# Patient Record
Sex: Female | Born: 1995 | Race: Black or African American | Hispanic: No | Smoking: Former smoker
Health system: Southern US, Community
[De-identification: ages and names within clinical notes are randomized; demographics above are authoritative.]

## PROBLEM LIST (undated history)

## (undated) ENCOUNTER — Inpatient Hospital Stay: Payer: Self-pay

## (undated) DIAGNOSIS — R51 Headache: Secondary | ICD-10-CM

## (undated) HISTORY — DX: Headache: R51

---
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
## 2014-01-29 ENCOUNTER — Encounter (HOSPITAL_COMMUNITY): Payer: Self-pay

## 2014-01-29 ENCOUNTER — Emergency Department (HOSPITAL_COMMUNITY)
Admission: EM | Admit: 2014-01-29 | Discharge: 2014-01-29 | Discharge status: Against Medical Advice | Attending: Emergency Medicine | Admitting: Emergency Medicine

## 2014-01-29 DIAGNOSIS — N898 Other specified noninflammatory disorders of vagina: Secondary | ICD-10-CM | POA: Diagnosis not present

## 2014-01-29 DIAGNOSIS — R109 Unspecified abdominal pain: Secondary | ICD-10-CM | POA: Diagnosis present

## 2014-01-29 LAB — URINALYSIS, ROUTINE W REFLEX MICROSCOPIC
BILIRUBIN URINE: NEGATIVE
Glucose, UA: NEGATIVE mg/dL
Ketones, ur: NEGATIVE mg/dL
Nitrite: NEGATIVE
PROTEIN: NEGATIVE mg/dL
SPECIFIC GRAVITY, URINE: 1.019 (ref 1.005–1.030)
Urobilinogen, UA: 0.2 mg/dL (ref 0.0–1.0)
pH: 7.5 (ref 5.0–8.0)

## 2014-01-29 LAB — CBC WITH DIFFERENTIAL/PLATELET
BASOS PCT: 0 % (ref 0–1)
Basophils Absolute: 0 10*3/uL (ref 0.0–0.1)
Eosinophils Absolute: 0.1 10*3/uL (ref 0.0–0.7)
Eosinophils Relative: 2 % (ref 0–5)
HCT: 37.9 % (ref 36.0–46.0)
Hemoglobin: 12.7 g/dL (ref 12.0–15.0)
LYMPHS ABS: 2.4 10*3/uL (ref 0.7–4.0)
LYMPHS PCT: 39 % (ref 12–46)
MCH: 29.3 pg (ref 26.0–34.0)
MCHC: 33.5 g/dL (ref 30.0–36.0)
MCV: 87.5 fL (ref 78.0–100.0)
MONOS PCT: 6 % (ref 3–12)
Monocytes Absolute: 0.4 10*3/uL (ref 0.1–1.0)
Neutro Abs: 3.3 10*3/uL (ref 1.7–7.7)
Neutrophils Relative %: 53 % (ref 43–77)
Platelets: 181 10*3/uL (ref 150–400)
RBC: 4.33 MIL/uL (ref 3.87–5.11)
RDW: 13 % (ref 11.5–15.5)
WBC: 6.2 10*3/uL (ref 4.0–10.5)

## 2014-01-29 LAB — COMPREHENSIVE METABOLIC PANEL
ALK PHOS: 45 U/L (ref 39–117)
ALT: 12 U/L (ref 0–35)
AST: 16 U/L (ref 0–37)
Albumin: 4.5 g/dL (ref 3.5–5.2)
Anion gap: 4 — ABNORMAL LOW (ref 5–15)
BUN: 10 mg/dL (ref 6–23)
CO2: 26 mmol/L (ref 19–32)
CREATININE: 0.59 mg/dL (ref 0.50–1.10)
Calcium: 9.3 mg/dL (ref 8.4–10.5)
Chloride: 109 mEq/L (ref 96–112)
GFR calc non Af Amer: >90 mL/min (ref >90)
GLUCOSE: 93 mg/dL (ref 70–99)
POTASSIUM: 4 mmol/L (ref 3.5–5.1)
SODIUM: 139 mmol/L (ref 135–145)
TOTAL PROTEIN: 7.7 g/dL (ref 6.0–8.3)
Total Bilirubin: 0.8 mg/dL (ref 0.3–1.2)

## 2014-01-29 LAB — URINE MICROSCOPIC-ADD ON

## 2014-01-29 LAB — POC URINE PREG, ED: Preg Test, Ur: NEGATIVE

## 2014-01-29 NOTE — ED Notes (Signed)
Pt sts that she has to go pick up her mother and has to leave. Pt sts that she will have to return in the am to complete exam and testing. Pt notified that her blood work is normal and her POC is negative, per her request

## 2014-01-29 NOTE — ED Notes (Signed)
Per pt, discharge and odor since Dec 25.  Pt continues with same and is having abdominal pain.  This week she has noticed white/milky discharge.  No fever.  No burning with urination.

## 2015-01-23 NOTE — L&D Delivery Note (Signed)
Delivery Note At  a viable and healthy female "Christy Schmidt"was delivered via  (Presentation:OA ; Vertex ).  APGAR:8 ,9 ; weight 8 lb 11 oz (3940 grams).   Placenta status:deliverwd intact with 3 vessel  Cord:  with the following complications:mild shoulder dystocia, reduced with position changes and mcRoberts.    Anesthesia:  epiduarl Episiotomy:  none Lacerations:  none Suture Repair: none Est. Blood Loss (mL):  400  Mom to postpartum.  Baby to Couplet care / Skin to Skin.  Melody N Shambley 04/16/2015, 12:23 PM

## 2015-02-15 ENCOUNTER — Encounter: Payer: Self-pay | Admitting: Obstetrics and Gynecology

## 2015-02-15 ENCOUNTER — Ambulatory Visit (INDEPENDENT_AMBULATORY_CARE_PROVIDER_SITE_OTHER): Admitting: Obstetrics and Gynecology

## 2015-02-15 VITALS — BP 120/68 | HR 93 | Wt 182.6 lb

## 2015-02-15 DIAGNOSIS — Z331 Pregnant state, incidental: Secondary | ICD-10-CM

## 2015-02-15 NOTE — Progress Notes (Signed)
Transfer ROB- needs childbirth classes- schedule given; will have records faxed  By next visit. Glucola next week

## 2015-02-15 NOTE — Progress Notes (Signed)
NOB-pt transferred from [facility], pt denies any complaints, hasn't done her glucola yet, blood consent signed

## 2015-02-15 NOTE — Patient Instructions (Signed)

## 2015-02-21 ENCOUNTER — Other Ambulatory Visit: Payer: Self-pay | Admitting: *Deleted

## 2015-02-21 DIAGNOSIS — Z331 Pregnant state, incidental: Secondary | ICD-10-CM

## 2015-02-21 DIAGNOSIS — Z131 Encounter for screening for diabetes mellitus: Secondary | ICD-10-CM

## 2015-02-22 ENCOUNTER — Ambulatory Visit (INDEPENDENT_AMBULATORY_CARE_PROVIDER_SITE_OTHER): Admitting: Obstetrics and Gynecology

## 2015-02-22 ENCOUNTER — Encounter: Payer: Self-pay | Admitting: Obstetrics and Gynecology

## 2015-02-22 ENCOUNTER — Other Ambulatory Visit: Payer: Self-pay

## 2015-02-22 VITALS — BP 117/64 | HR 76 | Wt 186.6 lb

## 2015-02-22 DIAGNOSIS — Z23 Encounter for immunization: Secondary | ICD-10-CM | POA: Diagnosis not present

## 2015-02-22 DIAGNOSIS — Z131 Encounter for screening for diabetes mellitus: Secondary | ICD-10-CM

## 2015-02-22 DIAGNOSIS — Z331 Pregnant state, incidental: Secondary | ICD-10-CM | POA: Diagnosis not present

## 2015-02-22 MED ORDER — TETANUS-DIPHTH-ACELL PERTUSSIS 5-2.5-18.5 LF-MCG/0.5 IM SUSP
0.5000 mL | Freq: Once | INTRAMUSCULAR | Status: AC
  Administered 2015-02-22: 0.5 mL via INTRAMUSCULAR

## 2015-02-22 NOTE — Progress Notes (Signed)
ROB-glucola done today, tdap given, pt is c/o fatigue, very tired

## 2015-02-22 NOTE — Progress Notes (Signed)
ROB- glucola obtained today, not sleeping well- OK to try Tylenol PM

## 2015-02-23 ENCOUNTER — Other Ambulatory Visit: Payer: Self-pay | Admitting: Obstetrics and Gynecology

## 2015-02-23 DIAGNOSIS — O99019 Anemia complicating pregnancy, unspecified trimester: Secondary | ICD-10-CM

## 2015-02-23 LAB — HEMOGLOBIN AND HEMATOCRIT, BLOOD
HEMOGLOBIN: 10.5 g/dL — AB (ref 11.1–15.9)
Hematocrit: 30.2 % — ABNORMAL LOW (ref 34.0–46.6)

## 2015-02-23 LAB — GLUCOSE, 1 HOUR GESTATIONAL: GESTATIONAL DIABETES SCREEN: 88 mg/dL (ref 65–139)

## 2015-02-23 MED ORDER — FUSION PLUS PO CAPS: 1.0000 | ORAL_CAPSULE | Freq: Every day | ORAL | Status: DC

## 2015-02-24 ENCOUNTER — Telehealth: Payer: Self-pay | Admitting: *Deleted

## 2015-02-24 ENCOUNTER — Telehealth: Payer: Self-pay | Admitting: Obstetrics and Gynecology

## 2015-02-24 NOTE — Telephone Encounter (Signed)
Iron med is too espensive, tricare is her ins and she wants one that is cheaper or $0.00

## 2015-02-24 NOTE — Telephone Encounter (Signed)
Notified pt of lab results 

## 2015-02-24 NOTE — Telephone Encounter (Signed)
-----   Message from [place], [place] sent at 02/23/2015  7:08 PM EST ----- Please let her know she passed her glucola, but is anemic- I sent in order for fusion and want her to take one a day Monday thru Friday, each week

## 2015-02-25 ENCOUNTER — Other Ambulatory Visit: Payer: Self-pay | Admitting: Obstetrics and Gynecology

## 2015-02-25 MED ORDER — FERROUS FUMARATE-VITAMIN C 65-125 MG PO TABS: 1.0000 | ORAL_TABLET | Freq: Every morning | ORAL | Status: DC

## 2015-02-25 NOTE — Telephone Encounter (Signed)
pls advise

## 2015-03-08 ENCOUNTER — Encounter: Admitting: Obstetrics and Gynecology

## 2015-03-24 ENCOUNTER — Ambulatory Visit (INDEPENDENT_AMBULATORY_CARE_PROVIDER_SITE_OTHER): Admitting: Obstetrics and Gynecology

## 2015-03-24 ENCOUNTER — Encounter: Payer: Self-pay | Admitting: Obstetrics and Gynecology

## 2015-03-24 VITALS — BP 118/55 | HR 93 | Wt 189.0 lb

## 2015-03-24 DIAGNOSIS — Z3685 Encounter for antenatal screening for Streptococcus B: Secondary | ICD-10-CM

## 2015-03-24 DIAGNOSIS — Z113 Encounter for screening for infections with a predominantly sexual mode of transmission: Secondary | ICD-10-CM

## 2015-03-24 DIAGNOSIS — Z36 Encounter for antenatal screening of mother: Secondary | ICD-10-CM

## 2015-03-24 DIAGNOSIS — Z331 Pregnant state, incidental: Secondary | ICD-10-CM

## 2015-03-24 LAB — POCT URINALYSIS DIPSTICK
BILIRUBIN UA: NEGATIVE
Blood, UA: NEGATIVE
GLUCOSE UA: NEGATIVE
KETONES UA: NEGATIVE
LEUKOCYTES UA: NEGATIVE
NITRITE UA: NEGATIVE
PH UA: 6.5
Protein, UA: NEGATIVE
Spec Grav, UA: <=1.005
Urobilinogen, UA: 0.2

## 2015-03-24 NOTE — Patient Instructions (Signed)
Braxton Hicks Contractions °Contractions of the uterus can occur throughout pregnancy. Contractions are not always a sign that you are in labor.  °WHAT ARE BRAXTON HICKS CONTRACTIONS?  °Contractions that occur before labor are called Braxton Hicks contractions, or false labor. Toward the end of pregnancy (32-34 weeks), these contractions can develop more often and may become more forceful. This is not true labor because these contractions do not result in opening (dilatation) and thinning of the cervix. They are sometimes difficult to tell apart from true labor because these contractions can be forceful and people have different pain tolerances. You should not feel embarrassed if you go to the hospital with false labor. Sometimes, the only way to tell if you are in true labor is for your health care provider to look for changes in the cervix. °If there are no prenatal problems or other health problems associated with the pregnancy, it is completely safe to be sent home with false labor and await the onset of true labor. °HOW CAN YOU TELL THE DIFFERENCE BETWEEN TRUE AND FALSE LABOR? °False Labor °· The contractions of false labor are usually shorter and not as hard as those of true labor.   °· The contractions are usually irregular.   °· The contractions are often felt in the front of the lower abdomen and in the groin.   °· The contractions may go away when you walk around or change positions while lying down.   °· The contractions get weaker and are shorter lasting as time goes on.   °· The contractions do not usually become progressively stronger, regular, and closer together as with true labor.   °True Labor °· Contractions in true labor last 30-70 seconds, become very regular, usually become more intense, and increase in frequency.   °· The contractions do not go away with walking.   °· The discomfort is usually felt in the top of the uterus and spreads to the lower abdomen and low back.   °· True labor can be  determined by your health care provider with an exam. This will show that the cervix is dilating and getting thinner.   °WHAT TO REMEMBER °· Keep up with your usual exercises and follow other instructions given by your health care provider.   °· Take medicines as directed by your health care provider.   °· Keep your regular prenatal appointments.   °· Eat and drink lightly if you think you are going into labor.   °· If Braxton Hicks contractions are making you uncomfortable:   °¨ Change your position from lying down or resting to walking, or from walking to resting.   °¨ Sit and rest in a tub of warm water.   °¨ Drink 2-3 glasses of water. Dehydration may cause these contractions.   °¨ Do slow and deep breathing several times an hour.   °WHEN SHOULD I SEEK IMMEDIATE MEDICAL CARE? °Seek immediate medical care if: °· Your contractions become stronger, more regular, and closer together.   °· You have fluid leaking or gushing from your vagina.   °· You have a fever.   °· You pass blood-tinged mucus.   °· You have vaginal bleeding.   °· You have continuous abdominal pain.   °· You have low back pain that you never had before.   °· You feel your baby's head pushing down and causing pelvic pressure.   °· Your baby is not moving as much as it used to.   °  °This information is not intended to replace advice given to you by your health care provider. Make sure you discuss any questions you have with your health care   provider. °  °Document Released: 01/08/2005 Document Revised: 01/13/2013 Document Reviewed: 10/20/2012 °Elsevier Interactive Patient Education ©2016 [employer]vier Inc. ° °

## 2015-03-24 NOTE — Progress Notes (Signed)
ROB- cultures obtained, pt denies any complaints 

## 2015-03-24 NOTE — Progress Notes (Signed)
ROB- labor precautions discussed 

## 2015-03-26 LAB — GC/CHLAMYDIA PROBE AMP
Chlamydia trachomatis, NAA: NEGATIVE
NEISSERIA GONORRHOEAE BY PCR: NEGATIVE

## 2015-03-26 LAB — STREP GP B NAA: Strep Gp B NAA: NEGATIVE

## 2015-03-31 ENCOUNTER — Encounter: Admitting: Obstetrics and Gynecology

## 2015-04-01 ENCOUNTER — Encounter: Admitting: Obstetrics and Gynecology

## 2015-04-05 ENCOUNTER — Encounter: Payer: Self-pay | Admitting: Obstetrics and Gynecology

## 2015-04-05 ENCOUNTER — Ambulatory Visit (INDEPENDENT_AMBULATORY_CARE_PROVIDER_SITE_OTHER): Admitting: Obstetrics and Gynecology

## 2015-04-05 VITALS — BP 115/72 | HR 103 | Wt 193.5 lb

## 2015-04-05 DIAGNOSIS — Z331 Pregnant state, incidental: Secondary | ICD-10-CM

## 2015-04-05 LAB — POCT URINALYSIS DIPSTICK
Bilirubin, UA: NEGATIVE
Glucose, UA: NEGATIVE
Ketones, UA: NEGATIVE
Nitrite, UA: NEGATIVE
Spec Grav, UA: 1.01
Urobilinogen, UA: 0.2
pH, UA: 7

## 2015-04-05 NOTE — Patient Instructions (Signed)
Nonstress Test  The nonstress test is a procedure that monitors the fetus's heartbeat. The test will monitor the heartbeat when the fetus is at rest and while the fetus is moving. In a healthy fetus, there will be an increase in fetal heart rate when the fetus moves or kicks. The heart rate will decrease at rest. This test helps determine if the fetus is healthy. Your health care provider will look at a number of patterns in the heart rate tracing to make sure your baby is thriving. If there is concern, your health care provider may order additional tests or may suggest another course of action. This test is often done in the third trimester and can help determine if an early delivery is needed and safe. Common reasons to have this test are:  · You are past your due date.  · You have a high-risk pregnancy.  · You are feeling less movement than normal.  · You have lost a pregnancy in the past.  · Your health care provider suspects fetal growth problems.  · You have too much or too little amniotic fluid.  BEFORE THE PROCEDURE  · Eat a meal right before the test or as directed by your health care provider. Food may help stimulate fetal movements.  · Use the restroom right before the test.  PROCEDURE  · Two belts will be placed around your abdomen. These belts have monitors attached to them. One records the fetal heart rate and the other records uterine contractions.  · You may be asked to lie down on your side or to stay sitting upright.  · You may be given a button to press when you feel movement.  · The fetal heartbeat is listened to and watched on a screen. The heartbeat is recorded on a sheet of paper.  · If the fetus seems to be sleeping, you may be asked to drink some juice or soda, gently press your abdomen, or make some noise to wake the fetus.  AFTER THE PROCEDURE   Your health care provider will discuss the test results with you and make recommendations for the near future.     This information is not  intended to replace advice given to you by your health care provider. Make sure you discuss any questions you have with your health care provider.     Document Released: 12/29/2001 Document Revised: 01/29/2014 Document Reviewed: 02/12/2012  Elsevier Interactive Patient Education ©2016 Elsevier Inc.

## 2015-04-05 NOTE — Progress Notes (Signed)
ROB- doing well, BHC irregular; discussed post date care.

## 2015-04-05 NOTE — Progress Notes (Signed)
ROB- pt is having some pelvic pressure, braxton hicks 

## 2015-04-07 ENCOUNTER — Encounter: Admitting: Obstetrics and Gynecology

## 2015-04-11 ENCOUNTER — Other Ambulatory Visit: Payer: Self-pay | Admitting: Obstetrics and Gynecology

## 2015-04-11 DIAGNOSIS — Z331 Pregnant state, incidental: Secondary | ICD-10-CM

## 2015-04-11 DIAGNOSIS — O480 Post-term pregnancy: Secondary | ICD-10-CM

## 2015-04-12 ENCOUNTER — Ambulatory Visit (INDEPENDENT_AMBULATORY_CARE_PROVIDER_SITE_OTHER): Admitting: Obstetrics and Gynecology

## 2015-04-12 VITALS — BP 121/60 | HR 98 | Wt 199.2 lb

## 2015-04-12 DIAGNOSIS — O99019 Anemia complicating pregnancy, unspecified trimester: Secondary | ICD-10-CM | POA: Diagnosis not present

## 2015-04-12 LAB — POCT URINALYSIS DIPSTICK
BILIRUBIN UA: NEGATIVE
GLUCOSE UA: NEGATIVE
KETONES UA: NEGATIVE
LEUKOCYTES UA: NEGATIVE
NITRITE UA: NEGATIVE
PH UA: 7.5
Protein, UA: NEGATIVE
Spec Grav, UA: 1.01
Urobilinogen, UA: NEGATIVE

## 2015-04-12 NOTE — Progress Notes (Signed)
NONSTRESS TEST INTERPRETATION  INDICATIONS: POST DATES, DECREASED FETAL MOVEMENT  FHR baseline: 130-140 RESULTS: reactive COMMENTS: f/u as scheduled   PLAN: 1. Continue fetal kick counts twice a day. 2. Continue antepartum testing as scheduled-Biweekly   Fenton Mallingebbie Sahaj Bona, LPN

## 2015-04-14 ENCOUNTER — Encounter: Admitting: Obstetrics and Gynecology

## 2015-04-15 ENCOUNTER — Encounter: Payer: Self-pay | Admitting: Obstetrics and Gynecology

## 2015-04-15 ENCOUNTER — Ambulatory Visit (INDEPENDENT_AMBULATORY_CARE_PROVIDER_SITE_OTHER): Admitting: Obstetrics and Gynecology

## 2015-04-15 ENCOUNTER — Other Ambulatory Visit

## 2015-04-15 ENCOUNTER — Inpatient Hospital Stay
Admission: EM | Admit: 2015-04-15 | Discharge: 2015-04-18 | DRG: 774 | Disposition: A | Discharge status: Home / Self Care | Attending: Obstetrics and Gynecology | Admitting: Obstetrics and Gynecology

## 2015-04-15 ENCOUNTER — Ambulatory Visit (INDEPENDENT_AMBULATORY_CARE_PROVIDER_SITE_OTHER)

## 2015-04-15 VITALS — BP 135/60 | HR 106 | Wt 200.0 lb

## 2015-04-15 DIAGNOSIS — Z3493 Encounter for supervision of normal pregnancy, unspecified, third trimester: Secondary | ICD-10-CM

## 2015-04-15 DIAGNOSIS — O99019 Anemia complicating pregnancy, unspecified trimester: Secondary | ICD-10-CM

## 2015-04-15 DIAGNOSIS — Z331 Pregnant state, incidental: Secondary | ICD-10-CM | POA: Diagnosis not present

## 2015-04-15 DIAGNOSIS — O4100X0 Oligohydramnios, unspecified trimester, not applicable or unspecified: Secondary | ICD-10-CM | POA: Diagnosis present

## 2015-04-15 DIAGNOSIS — O480 Post-term pregnancy: Secondary | ICD-10-CM | POA: Diagnosis present

## 2015-04-15 DIAGNOSIS — Z3A40 40 weeks gestation of pregnancy: Secondary | ICD-10-CM

## 2015-04-15 DIAGNOSIS — Z3403 Encounter for supervision of normal first pregnancy, third trimester: Secondary | ICD-10-CM | POA: Diagnosis not present

## 2015-04-15 DIAGNOSIS — Z3483 Encounter for supervision of other normal pregnancy, third trimester: Secondary | ICD-10-CM

## 2015-04-15 DIAGNOSIS — O4103X0 Oligohydramnios, third trimester, not applicable or unspecified: Secondary | ICD-10-CM

## 2015-04-15 LAB — POCT URINALYSIS DIPSTICK
BILIRUBIN UA: NEGATIVE
GLUCOSE UA: NEGATIVE
Ketones, UA: NEGATIVE
NITRITE UA: NEGATIVE
Spec Grav, UA: 1.015
Urobilinogen, UA: 0.2
pH, UA: 7

## 2015-04-15 LAB — CBC
HCT: 31.1 % — ABNORMAL LOW (ref 35.0–47.0)
Hemoglobin: 10.6 g/dL — ABNORMAL LOW (ref 12.0–16.0)
MCH: 29.7 pg (ref 26.0–34.0)
MCHC: 34 g/dL (ref 32.0–36.0)
MCV: 87.3 fL (ref 80.0–100.0)
PLATELETS: 250 10*3/uL (ref 150–440)
RBC: 3.56 MIL/uL — ABNORMAL LOW (ref 3.80–5.20)
RDW: 14 % (ref 11.5–14.5)
WBC: 10.6 10*3/uL (ref 3.6–11.0)

## 2015-04-15 LAB — CHLAMYDIA/NGC RT PCR (ARMC ONLY)
Chlamydia Tr: NOT DETECTED
N gonorrhoeae: NOT DETECTED

## 2015-04-15 LAB — ABO/RH: ABO/RH(D): O POS

## 2015-04-15 MED ORDER — OXYTOCIN 40 UNITS IN LACTATED RINGERS INFUSION - SIMPLE MED
1.0000 m[IU]/min | INTRAVENOUS | Status: DC
  Administered 2015-04-15 – 2015-04-16 (×2): 1 m[IU]/min via INTRAVENOUS
  Filled 2015-04-15 (×2): qty 1000

## 2015-04-15 MED ORDER — OXYTOCIN 40 UNITS IN LACTATED RINGERS INFUSION - SIMPLE MED: 2.5000 [IU]/h | INTRAVENOUS | Status: DC

## 2015-04-15 MED ORDER — LIDOCAINE HCL (PF) 1 % IJ SOLN: 30.0000 mL | INTRAMUSCULAR | Status: DC | PRN

## 2015-04-15 MED ORDER — FENTANYL CITRATE (PF) 100 MCG/2ML IJ SOLN
50.0000 ug | INTRAMUSCULAR | Status: DC | PRN
  Administered 2015-04-16: 100 ug via INTRAVENOUS
  Administered 2015-04-16: 50 ug via INTRAVENOUS
  Filled 2015-04-15 (×2): qty 2

## 2015-04-15 MED ORDER — CITRIC ACID-SODIUM CITRATE 334-500 MG/5ML PO SOLN: 30.0000 mL | ORAL | Status: DC | PRN

## 2015-04-15 MED ORDER — LACTATED RINGERS IV SOLN
500.0000 mL | INTRAVENOUS | Status: DC | PRN
  Administered 2015-04-16: 500 mL via INTRAVENOUS

## 2015-04-15 MED ORDER — ONDANSETRON HCL 4 MG/2ML IJ SOLN: 4.0000 mg | Freq: Four times a day (QID) | INTRAMUSCULAR | Status: DC | PRN

## 2015-04-15 MED ORDER — LACTATED RINGERS IV SOLN
INTRAVENOUS | Status: DC
  Administered 2015-04-15 – 2015-04-16 (×3): via INTRAVENOUS

## 2015-04-15 MED ORDER — TERBUTALINE SULFATE 1 MG/ML IJ SOLN: 0.2500 mg | Freq: Once | INTRAMUSCULAR | Status: DC | PRN

## 2015-04-15 MED ORDER — OXYTOCIN BOLUS FROM INFUSION
500.0000 mL | INTRAVENOUS | Status: DC
  Administered 2015-04-16: 500 mL via INTRAVENOUS

## 2015-04-15 MED ORDER — ACETAMINOPHEN 325 MG PO TABS: 650.0000 mg | ORAL_TABLET | ORAL | Status: DC | PRN

## 2015-04-15 NOTE — Progress Notes (Signed)
Christy Schmidt is a 20 y.o. G1P0 at 4322w4d by LMP admitted for induction of labor due to Post dates and Low amniotic fluid..  Subjective: Denies contractions, good fetal mov't  Objective: Ht 5\' 5"  (1.651 m)  Wt 90.719 kg (200 lb)  BMI 33.28 kg/m2  LMP 07/05/2014      Fetal Wellbeing:  Category I UC:   irregular,  SVE:    2/70/-2 posterior in office  Labs: Lab Results  Component Value Date   WBC 6.2 01/29/2014   HGB 12.7 01/29/2014   HCT 30.2* 02/22/2015   MCV 87.5 01/29/2014   PLT 181 01/29/2014    Assessment / Plan: oligohydramnious at 468w5d for pitocin IOL  Labor: awaiting starting pitocin Preeclampsia:  no signs or symptoms of toxicity Pain Control:  NA Anticipated MOD:  NSVD  Christy Schmidt Christy Schmidt,CNM 04/15/2015, 6:15 PM

## 2015-04-15 NOTE — OB Triage Note (Signed)
Patient admitted for induction of labor from office. Patient denies complaint of pain, vaginal discharge or bleeding. Family at bedside. Will continue to monitor.

## 2015-04-15 NOTE — H&P (Signed)
Christy Schmidt is a 20 y.o. G1P0 female at 6776w4d presenting for IOL due to postdates and oligohydramnious. History OB History    Gravida Para Term Preterm AB TAB SAB Ectopic Multiple Living   1              Past Medical History  Diagnosis Date  . ZOXWRUEA(540.9Headache(784.0)    Past Surgical History  Procedure Laterality Date  . Other surgical history Right 2010    Bone Removed on Right Hand 3rd Digit   Family History: family history includes Depression in her mother; Migraines in her cousin, maternal grandmother, and mother. Social History:  reports that she has never smoked. She has never used smokeless tobacco. She reports that she does not drink alcohol or use illicit drugs.   Prenatal Transfer Tool  Maternal Diabetes: No Genetic Screening: Normal Maternal Ultrasounds/Referrals: Abnormal:  Findings:   Other:oligohydramnious and Grade 3 placenta Fetal Ultrasounds or other Referrals:  None Maternal Substance Abuse:  No Significant Maternal Medications:  None Significant Maternal Lab Results:  None Other Comments:  None  ROS  Dilation: 2.5 Effacement (%): 80 Station: -1 Blood pressure 135/60, pulse 106, weight 200 lb (90.719 kg), last menstrual period 07/05/2014. Exam Physical Exam  Prenatal labs: ABO, Rh:   Antibody:   Rubella:   RPR:   NR HBsAg:   neg HIV:   neg GBS: Negative (03/02 1213)   Assessment/Plan: IOL with pitocin due to postdates and oligohydramnious   Christy Schmidt,CNM 04/15/2015, 3:47 PM

## 2015-04-15 NOTE — Progress Notes (Signed)
Rob nst- post dates U/s- growth  NONSTRESS TEST INTERPRETATION   INDICATIONS: post dates  FHR baseline: 130-150 RESULTS:Reactive COMMENTS:    PLAN: 1. Continue fetal kick counts twice a day. 2. Continue antepartum testing as scheduled-Biweekly  Darol Destinerystal Tamecca Artiga, CMA

## 2015-04-15 NOTE — Progress Notes (Signed)
Indications:  Growth and AFI Post Dates Findings:  Singleton intrauterine pregnancy is visualized with FHR at 155 BPM. Biometrics give an (U/S) Gestational age of 139 weeks and an (U/S) EDD of 04/22/15; this DOES NOT correlate with the clinically established EDD of 04/11/15.  Fetal presentation is Vertex, spine right lateral.  EFW: 3855 grams ( 8 lbs. 8 oz. ). 71st percentile for growth per Williams Placenta: Posterior and grade 3. AFI: Oligohydramnios at 6.0 cm which is below the 2nd percentile for 40 weeks.  Anatomic survey of the lateral ventricle, stomach, bladder and kidneys appears WNL.   Impression: 1. 39 week Viable Singleton Intrauterine pregnancy by U/S. 2. (U/S) EDD IS NOT consistent with Clinically established (LMP) EDD of 04/11/15. 3. Grade 3 placenta and oligohydramnios at 6.0 cm.  To L&D for delivery

## 2015-04-16 ENCOUNTER — Inpatient Hospital Stay: Admitting: Anesthesiology

## 2015-04-16 ENCOUNTER — Encounter: Payer: Self-pay | Admitting: Anesthesiology

## 2015-04-16 DIAGNOSIS — Z3403 Encounter for supervision of normal first pregnancy, third trimester: Secondary | ICD-10-CM

## 2015-04-16 LAB — CBC WITH DIFFERENTIAL/PLATELET
Basophils Absolute: 0 10*3/uL (ref 0–0.1)
Basophils Relative: 0 %
Eosinophils Absolute: 0 10*3/uL (ref 0–0.7)
HCT: 23.1 % — ABNORMAL LOW (ref 35.0–47.0)
Hemoglobin: 7.7 g/dL — ABNORMAL LOW (ref 12.0–16.0)
LYMPHS ABS: 1.4 10*3/uL (ref 1.0–3.6)
MCH: 29 pg (ref 26.0–34.0)
MCHC: 33.5 g/dL (ref 32.0–36.0)
MCV: 86.4 fL (ref 80.0–100.0)
MONO ABS: 1.4 10*3/uL — AB (ref 0.2–0.9)
Neutro Abs: 15.1 10*3/uL — ABNORMAL HIGH (ref 1.4–6.5)
Neutrophils Relative %: 84 %
PLATELETS: 224 10*3/uL (ref 150–440)
RBC: 2.67 MIL/uL — ABNORMAL LOW (ref 3.80–5.20)
RDW: 13.8 % (ref 11.5–14.5)
WBC: 17.9 10*3/uL — ABNORMAL HIGH (ref 3.6–11.0)

## 2015-04-16 LAB — POSTPARTUM HEMORRHAGE PROTOCOL (BB NOTIFICATION): Initiate PPH protocol (BB notified): 2

## 2015-04-16 LAB — PROTIME-INR
INR: 1.12
PROTHROMBIN TIME: 14.6 s (ref 11.4–15.0)

## 2015-04-16 LAB — PREPARE RBC (CROSSMATCH)

## 2015-04-16 LAB — APTT: aPTT: 25 seconds (ref 24–36)

## 2015-04-16 LAB — FIBRIN DERIVATIVES D-DIMER (ARMC ONLY): Fibrin derivatives D-dimer (ARMC): 4014 — ABNORMAL HIGH (ref 0–499)

## 2015-04-16 LAB — FIBRINOGEN: Fibrinogen: 309 mg/dL (ref 210–470)

## 2015-04-16 MED ORDER — METHYLERGONOVINE MALEATE 0.2 MG/ML IJ SOLN
0.2000 mg | Freq: Once | INTRAMUSCULAR | Status: AC
  Administered 2015-04-16: 0.2 mg via INTRAMUSCULAR
  Filled 2015-04-16: qty 1

## 2015-04-16 MED ORDER — PRENATAL MULTIVITAMIN CH
1.0000 | ORAL_TABLET | Freq: Every day | ORAL | Status: DC
  Administered 2015-04-17 – 2015-04-18 (×2): 1 via ORAL
  Filled 2015-04-16 (×2): qty 1

## 2015-04-16 MED ORDER — SODIUM CHLORIDE 0.9% FLUSH: 3.0000 mL | INTRAVENOUS | Status: DC | PRN

## 2015-04-16 MED ORDER — SODIUM CHLORIDE 0.9 % IV SOLN
Freq: Once | INTRAVENOUS | Status: AC
  Administered 2015-04-16: 21:00:00 via INTRAVENOUS

## 2015-04-16 MED ORDER — ACETAMINOPHEN 325 MG PO TABS
650.0000 mg | ORAL_TABLET | ORAL | Status: DC | PRN
  Administered 2015-04-17: 650 mg via ORAL
  Filled 2015-04-16: qty 2

## 2015-04-16 MED ORDER — KETOROLAC TROMETHAMINE 30 MG/ML IJ SOLN: 30.0000 mg | Freq: Four times a day (QID) | INTRAMUSCULAR | Status: AC | PRN

## 2015-04-16 MED ORDER — ONDANSETRON HCL 4 MG/2ML IJ SOLN: 4.0000 mg | Freq: Three times a day (TID) | INTRAMUSCULAR | Status: DC | PRN

## 2015-04-16 MED ORDER — MEPERIDINE HCL 25 MG/ML IJ SOLN: 6.2500 mg | INTRAMUSCULAR | Status: DC | PRN

## 2015-04-16 MED ORDER — AMMONIA AROMATIC IN INHA
RESPIRATORY_TRACT | Status: AC
  Filled 2015-04-16: qty 10

## 2015-04-16 MED ORDER — WITCH HAZEL-GLYCERIN EX PADS
1.0000 | MEDICATED_PAD | CUTANEOUS | Status: DC | PRN
Start: 2015-04-16 — End: 2015-04-18

## 2015-04-16 MED ORDER — BENZOCAINE-MENTHOL 20-0.5 % EX AERO: 1.0000 "application " | INHALATION_SPRAY | CUTANEOUS | Status: DC | PRN

## 2015-04-16 MED ORDER — LANOLIN HYDROUS EX OINT: TOPICAL_OINTMENT | CUTANEOUS | Status: DC | PRN

## 2015-04-16 MED ORDER — DIPHENHYDRAMINE HCL 25 MG PO CAPS: 25.0000 mg | ORAL_CAPSULE | Freq: Four times a day (QID) | ORAL | Status: DC | PRN

## 2015-04-16 MED ORDER — SODIUM CHLORIDE 0.9 % IV SOLN
Freq: Once | INTRAVENOUS | Status: AC
  Administered 2015-04-16: 16:00:00 via INTRAVENOUS

## 2015-04-16 MED ORDER — ONDANSETRON HCL 4 MG PO TABS: 4.0000 mg | ORAL_TABLET | ORAL | Status: DC | PRN

## 2015-04-16 MED ORDER — OXYCODONE-ACETAMINOPHEN 5-325 MG PO TABS
1.0000 | ORAL_TABLET | ORAL | Status: DC | PRN
Start: 2015-04-16 — End: 2015-04-18
  Administered 2015-04-17 – 2015-04-18 (×5): 2 via ORAL
  Filled 2015-04-16 (×5): qty 2

## 2015-04-16 MED ORDER — NALOXONE HCL 2 MG/2ML IJ SOSY
1.0000 ug/kg/h | PREFILLED_SYRINGE | INTRAVENOUS | Status: DC | PRN
  Filled 2015-04-16: qty 2

## 2015-04-16 MED ORDER — NALOXONE HCL 0.4 MG/ML IJ SOLN: 0.4000 mg | INTRAMUSCULAR | Status: DC | PRN

## 2015-04-16 MED ORDER — DIPHENHYDRAMINE HCL 50 MG/ML IJ SOLN
25.0000 mg | Freq: Once | INTRAMUSCULAR | Status: AC
Start: 2015-04-16 — End: 2015-04-16
  Administered 2015-04-16: 25 mg via INTRAVENOUS
  Filled 2015-04-16: qty 1

## 2015-04-16 MED ORDER — SODIUM CHLORIDE 0.9 % IV SOLN
INTRAVENOUS | Status: DC | PRN
  Administered 2015-04-16 (×2): 5 mL via EPIDURAL

## 2015-04-16 MED ORDER — FENTANYL 2.5 MCG/ML W/ROPIVACAINE 0.2% IN NS 100 ML EPIDURAL INFUSION (ARMC-ANES)
EPIDURAL | Status: AC
  Administered 2015-04-16: 10 mL/h via EPIDURAL
  Filled 2015-04-16: qty 100

## 2015-04-16 MED ORDER — ONDANSETRON HCL 4 MG/2ML IJ SOLN: 4.0000 mg | INTRAMUSCULAR | Status: DC | PRN

## 2015-04-16 MED ORDER — LIDOCAINE-EPINEPHRINE (PF) 1.5 %-1:200000 IJ SOLN
INTRAMUSCULAR | Status: DC | PRN
  Administered 2015-04-16: 3 mL via EPIDURAL

## 2015-04-16 MED ORDER — OXYTOCIN 10 UNIT/ML IJ SOLN
10.0000 [IU]/h | INTRAVENOUS | Status: DC
  Administered 2015-04-16: 10 [IU]/h via INTRAVENOUS
  Filled 2015-04-16: qty 10

## 2015-04-16 MED ORDER — DIPHENHYDRAMINE HCL 50 MG/ML IJ SOLN: 12.5000 mg | INTRAMUSCULAR | Status: DC | PRN

## 2015-04-16 MED ORDER — NALBUPHINE HCL 10 MG/ML IJ SOLN: 5.0000 mg | INTRAMUSCULAR | Status: DC | PRN

## 2015-04-16 MED ORDER — NALBUPHINE HCL 10 MG/ML IJ SOLN: 5.0000 mg | Freq: Once | INTRAMUSCULAR | Status: DC | PRN

## 2015-04-16 MED ORDER — LACTATED RINGERS IV BOLUS (SEPSIS): 1000.0000 mL | Freq: Once | INTRAVENOUS | Status: DC

## 2015-04-16 MED ORDER — OXYTOCIN 10 UNIT/ML IJ SOLN
INTRAMUSCULAR | Status: AC
  Filled 2015-04-16: qty 2

## 2015-04-16 MED ORDER — METHYLERGONOVINE MALEATE 0.2 MG PO TABS
0.2000 mg | ORAL_TABLET | Freq: Four times a day (QID) | ORAL | Status: DC
  Administered 2015-04-16 – 2015-04-17 (×3): 0.2 mg via ORAL
  Filled 2015-04-16 (×3): qty 1

## 2015-04-16 MED ORDER — ACETAMINOPHEN 325 MG PO TABS
650.0000 mg | ORAL_TABLET | Freq: Once | ORAL | Status: AC
  Administered 2015-04-16: 650 mg via ORAL
  Filled 2015-04-16: qty 2

## 2015-04-16 MED ORDER — SIMETHICONE 80 MG PO CHEW: 80.0000 mg | CHEWABLE_TABLET | ORAL | Status: DC | PRN

## 2015-04-16 MED ORDER — FENTANYL CITRATE (PF) 100 MCG/2ML IJ SOLN
100.0000 ug | Freq: Once | INTRAMUSCULAR | Status: AC
Start: 2015-04-16 — End: 2015-04-16
  Administered 2015-04-16: 100 ug via INTRAVENOUS

## 2015-04-16 MED ORDER — DIPHENHYDRAMINE HCL 25 MG PO CAPS: 25.0000 mg | ORAL_CAPSULE | ORAL | Status: DC | PRN

## 2015-04-16 MED ORDER — IBUPROFEN 600 MG PO TABS
600.0000 mg | ORAL_TABLET | Freq: Four times a day (QID) | ORAL | Status: DC
  Administered 2015-04-16 – 2015-04-18 (×6): 600 mg via ORAL
  Filled 2015-04-16 (×8): qty 1

## 2015-04-16 MED ORDER — OXYCODONE HCL 5 MG PO TABS
5.0000 mg | ORAL_TABLET | ORAL | Status: DC | PRN
Start: 2015-04-16 — End: 2015-04-18
  Administered 2015-04-16: 5 mg via ORAL

## 2015-04-16 MED ORDER — DOCUSATE SODIUM 100 MG PO CAPS
100.0000 mg | ORAL_CAPSULE | Freq: Two times a day (BID) | ORAL | Status: DC
  Administered 2015-04-16 – 2015-04-18 (×4): 100 mg via ORAL
  Filled 2015-04-16 (×4): qty 1

## 2015-04-16 MED ORDER — LIDOCAINE HCL (PF) 1 % IJ SOLN
INTRAMUSCULAR | Status: AC
  Filled 2015-04-16: qty 30

## 2015-04-16 MED ORDER — ZOLPIDEM TARTRATE 5 MG PO TABS: 5.0000 mg | ORAL_TABLET | Freq: Every evening | ORAL | Status: DC | PRN

## 2015-04-16 MED ORDER — FENTANYL 2.5 MCG/ML W/ROPIVACAINE 0.2% IN NS 100 ML EPIDURAL INFUSION (ARMC-ANES)
10.0000 mL/h | EPIDURAL | Status: DC
  Administered 2015-04-16 (×2): 10 mL/h via EPIDURAL
  Filled 2015-04-16: qty 100

## 2015-04-16 MED ORDER — MISOPROSTOL 200 MCG PO TABS
ORAL_TABLET | ORAL | Status: AC
  Administered 2015-04-16: 200 ug via RECTAL
  Filled 2015-04-16: qty 4

## 2015-04-16 MED ORDER — AMMONIA AROMATIC IN INHA
RESPIRATORY_TRACT | Status: AC
  Administered 2015-04-16: 15:00:00
  Filled 2015-04-16: qty 20

## 2015-04-16 MED ORDER — MISOPROSTOL 200 MCG PO TABS
200.0000 ug | ORAL_TABLET | Freq: Once | ORAL | Status: AC
  Administered 2015-04-16: 200 ug via RECTAL

## 2015-04-16 MED ORDER — DIBUCAINE 1 % RE OINT: 1.0000 "application " | TOPICAL_OINTMENT | RECTAL | Status: DC | PRN

## 2015-04-16 MED ORDER — FENTANYL CITRATE (PF) 100 MCG/2ML IJ SOLN
INTRAMUSCULAR | Status: AC
  Administered 2015-04-16: 100 ug via INTRAVENOUS
  Filled 2015-04-16: qty 2

## 2015-04-16 MED ORDER — METHYLERGONOVINE MALEATE 0.2 MG/ML IJ SOLN
INTRAMUSCULAR | Status: AC
  Administered 2015-04-16: 0.2 mg via INTRAMUSCULAR
  Filled 2015-04-16: qty 1

## 2015-04-16 MED ORDER — OXYCODONE HCL 5 MG PO TABS
10.0000 mg | ORAL_TABLET | ORAL | Status: DC | PRN
Start: 2015-04-16 — End: 2015-04-18
  Administered 2015-04-17: 10 mg via ORAL
  Filled 2015-04-16 (×2): qty 2

## 2015-04-16 MED ORDER — LACTATED RINGERS IV SOLN
INTRAVENOUS | Status: DC
  Administered 2015-04-16 – 2015-04-17 (×2): via INTRAVENOUS

## 2015-04-16 NOTE — Significant Event (Signed)
Called to assess patient who is approximately 3 hours postpartum who was noted to have an episode of heavy vaginal bleeding. Patient received uterine massage (perfomred by Yolanda BonineMelody Burr, CNM), cytotec 1000 mcg, and Methergine  0.2 mg IM x 1 dose. Also was previously receiving IV Pitocin 40 units in 1L of LR per standard postpartum orders.  At time of my arrival, bleeding had slowed significantly. Bedside ultrasound was performed to assess for retained products. Ultrasound noted heterogenous material at lower uterine segment, likely blood clots, but no evidence of retaine products. DIC panel and stat CBC were ordered. Patient is in process of starting 1 unit PRBCs. Total blood loss since delivery is ~ 1400 ml (1000 ml during current bleeding episode, and 400 from immediate postpartum blood loss).  Will continue to monitor with serial CBCs.  Will also place on Methergine 0.2 mg PO  4 x daily for 24-48 hours.   Hildred LaserAnika Teddie Mehta, MD [facility]

## 2015-04-16 NOTE — Anesthesia Preprocedure Evaluation (Signed)
Anesthesia Evaluation  Patient identified by MRN, date of birth, ID band Patient awake    Reviewed: Allergy & Precautions, H&P , NPO status , Patient's Chart, lab work & pertinent test results, reviewed documented beta blocker date and time   History of Anesthesia Complications Negative for: history of anesthetic complications  Airway Mallampati: III  TM Distance: >3 FB Neck ROM: full    Dental no notable dental hx. (+) Teeth Intact   Pulmonary neg pulmonary ROS,    Pulmonary exam normal breath sounds clear to auscultation       Cardiovascular Exercise Tolerance: Good negative cardio ROS Normal cardiovascular exam Rhythm:regular Rate:Normal     Neuro/Psych negative neurological ROS  negative psych ROS   GI/Hepatic Neg liver ROS, GERD  ,  Endo/Other  negative endocrine ROS  Renal/GU negative Renal ROS  negative genitourinary   Musculoskeletal   Abdominal   Peds  Hematology negative hematology ROS (+)   Anesthesia Other Findings Past Medical History:   Headache(784.0)                                              Reproductive/Obstetrics (+) Pregnancy                             Anesthesia Physical Anesthesia Plan  ASA: II  Anesthesia Plan: Epidural   Post-op Pain Management:    Induction:   Airway Management Planned:   Additional Equipment:   Intra-op Plan:   Post-operative Plan:   Informed Consent: I have reviewed the patients History and Physical, chart, labs and discussed the procedure including the risks, benefits and alternatives for the proposed anesthesia with the patient or authorized representative who has indicated his/her understanding and acceptance.   Dental Advisory Given  Plan Discussed with: Anesthesiologist, CRNA and Surgeon  Anesthesia Plan Comments:         Anesthesia Quick Evaluation

## 2015-04-16 NOTE — Progress Notes (Signed)
Post Partum Day 0 (7 hrs) Subjective: reports feeling tired,but is doing well otherwise, is in bed with foley cathertec and 2nd unit PRBC infusing.  Objective: Blood pressure 120/70, pulse 99, temperature 98.8 F (37.1 C), temperature source Oral, resp. rate 18, height 5\' 5"  (1.651 m), weight 90.719 kg (200 lb), last menstrual period 07/05/2014, SpO2 100 %.  Physical Exam:  General: alert, cooperative, fatigued, no distress and pale Lochia: appropriate Uterine Fundus: firm Incision: NA DVT Evaluation: No evidence of DVT seen on physical exam. Negative Homan's sign.   Recent Labs  04/15/15 1951 04/16/15 1505  HGB 10.6* 7.7*  HCT 31.1* 23.1*    Assessment/Plan: PPH- stable now and 2nd unit of 4 infusing. Will remain in bed and with foley till am. Infant feeding  Bottle;     LOS: 1 day   Melody N Shambley,CNM 04/16/2015, 7:22 PM

## 2015-04-16 NOTE — Progress Notes (Signed)
Christy Schmidt is a 20 y.o. G1P0 at 2568w5d by LMP admitted for induction of labor due to Post dates and Low amniotic fluid..  Subjective: Denies pain or pressure- epidural in place  Objective: BP 103/63 mmHg  Pulse 86  Temp(Src) 98.1 F (36.7 C) (Axillary)  Resp 14  Ht 5\' 5"  (1.651 m)  Wt 90.719 kg (200 lb)  BMI 33.28 kg/m2  SpO2 97%  LMP 07/05/2014 I/O last 3 completed shifts: In: 1853.6 [I.V.:1806.1; Other:47.5] Out: 175 [Urine:175]    Fetal Wellbeing:  Category I UC:   irregular, every 2-4 minutes, moderate to palpation SVE:   10/100/-1, AROM with moderate amounts clear fluid Labs: Lab Results  Component Value Date   WBC 10.6 04/15/2015   HGB 10.6* 04/15/2015   HCT 31.1* 04/15/2015   MCV 87.3 04/15/2015   PLT 250 04/15/2015    Assessment / Plan: Induction of labor due to postterm and oligohydramnious,  progressing well on pitocin  Labor: Progressing normally Preeclampsia:  labs stable Pain Control:  Epidural Anticipated MOD:  NSVD  Melody N Shambley,CNM 04/16/2015, 10:27 AM

## 2015-04-16 NOTE — Lactation Note (Signed)
This note was copied from a baby's chart. Lactation Consultation Note  Patient Name: Christy Theodis SatoCamisha Glenwood Schmidt ZOXWR'UToday's Date: 04/16/2015   Mom had questions about breastfeeding and/or pumping.  Discussed benefits of breast feeding for mom and baby.  FOB was not in favor of mom breastfeeding and kept making negative comments.  Mom finally said she just did not think it was for her.  Told mom I was here to support her with whatever decision she made.  Gave mom lactation name and number and encouraged to call if she changed her mind and I would be happy to address any further questions and assist her.  Maternal Data    Feeding Feeding Type: Bottle Fed - Formula Nipple Type: Slow - flow Length of feed: 15 min  LATCH Score/Interventions                      Lactation Tools Discussed/Used     Consult Status      Louis MeckelWilliams, Juliona Vales Kay 04/16/2015, 2:39 PM

## 2015-04-16 NOTE — Progress Notes (Signed)
Pt felt lightheaded while up to bathroom at 1430.  Birth place and Mother/Baby RN With pt.  Pt taken via wheelchair to bed with assistance.  200 ml of clots in hat.  Melody Ines BloomerBurr, CNM and nursing supervisor notified.  CNM expressed many large clots.  Clots and pads weighed.  Postpartum hemorrhage protocol initiated.  After ordered medications and PRBC's initiated, pt reports feeling better, and vital signs remain stable. Reynold BowenSusan Paisley Adriene Padula, RN 04/16/2015 8:03 PM

## 2015-04-16 NOTE — Significant Event (Addendum)
Rapid Response Event Note  Overview: called for rapid response, pt with post birth hemmorhage      Initial Focused Assessment: pt up to bathroom, passed out with vaginal bleeding. Arrived in room, pt alert and oriented x 4 laying in bed, lethargic, BP 112/70, HR 96, and 02 sats 100% on room air.   Interventions:Atom Solivan, CNM in room evacuated clots and did fundal massage, inserted foley. Orders placed for unit of PRBC's. Plan discussed with Remus BlakeJulie Braddy RN.   Event Summary:    at      at          [facility]   Called to room as [facility] rapid response activated; patient found in bathroom on toilet after fainting with nurses attempting to move her to the bed, had passed Schmidt large amount of blood and clots in the toilet (not all captured by hat); After successfully placing patient on bed I was able to manually extract Schmidt large amount of clots from lower uterine segment and cervix, and bleeding minimized with methergine IM and 800mg  cytotec rectally, foley placed and 800cc returned. VSS entire time, and O2 sats stayed at 100.  Patient was drowsy but awake and responsive the entire time.  Blood lose found to be 2100 with weighed pads. Dr Valentino Saxonherry notified and came in for evaluation.    Berman Grainger AvillaShambley, CNM

## 2015-04-16 NOTE — Anesthesia Procedure Notes (Signed)
Epidural Patient location during procedure: OB Start time: 04/16/2015 12:49 AM End time: 04/16/2015 1:06 AM  Staffing Anesthesiologist: Lenard SimmerKARENZ, Kadiatou Oplinger Performed by: anesthesiologist   Preanesthetic Checklist Completed: patient identified, site marked, surgical consent, pre-op evaluation, timeout performed, IV checked, risks and benefits discussed and monitors and equipment checked  Epidural Patient position: sitting Prep: ChloraPrep Patient monitoring: heart rate, continuous pulse ox and blood pressure Approach: midline Location: L3-L4 Injection technique: LOR saline  Needle:  Needle type: Tuohy  Needle gauge: 17 G Needle length: 9 cm and 9 Needle insertion depth: 6 cm Catheter type: closed end flexible Catheter size: 19 Gauge Catheter at skin depth: 11 cm Test dose: negative and 1.5% lidocaine with Epi 1:200 K  Assessment Events: blood not aspirated, injection not painful, no injection resistance, negative IV test and no paresthesia  Additional Notes   Patient tolerated the insertion well without complications.Reason for block:procedure for pain

## 2015-04-17 LAB — TYPE AND SCREEN
ABO/RH(D): O POS
ANTIBODY SCREEN: NEGATIVE
UNIT DIVISION: 0
UNIT DIVISION: 0
Unit division: 0
Unit division: 0

## 2015-04-17 LAB — CBC WITH DIFFERENTIAL/PLATELET
Basophils Absolute: 0 10*3/uL (ref 0–0.1)
EOS ABS: 0.1 10*3/uL (ref 0–0.7)
HCT: 30 % — ABNORMAL LOW (ref 35.0–47.0)
Hemoglobin: 10.5 g/dL — ABNORMAL LOW (ref 12.0–16.0)
LYMPHS ABS: 2.8 10*3/uL (ref 1.0–3.6)
Lymphocytes Relative: 16 %
MCH: 29.6 pg (ref 26.0–34.0)
MCHC: 35 g/dL (ref 32.0–36.0)
MCV: 84.7 fL (ref 80.0–100.0)
Monocytes Absolute: 1.4 10*3/uL — ABNORMAL HIGH (ref 0.2–0.9)
Neutro Abs: 13.2 10*3/uL — ABNORMAL HIGH (ref 1.4–6.5)
Neutrophils Relative %: 76 %
PLATELETS: 162 10*3/uL (ref 150–440)
RBC: 3.55 MIL/uL — AB (ref 3.80–5.20)
RDW: 14.3 % (ref 11.5–14.5)
WBC: 17.5 10*3/uL — AB (ref 3.6–11.0)

## 2015-04-17 LAB — CBC
HCT: 28.7 % — ABNORMAL LOW (ref 35.0–47.0)
Hemoglobin: 10 g/dL — ABNORMAL LOW (ref 12.0–16.0)
MCH: 29.6 pg (ref 26.0–34.0)
MCHC: 34.7 g/dL (ref 32.0–36.0)
MCV: 85.3 fL (ref 80.0–100.0)
PLATELETS: 153 10*3/uL (ref 150–440)
RBC: 3.37 MIL/uL — AB (ref 3.80–5.20)
RDW: 14.4 % (ref 11.5–14.5)
WBC: 14.7 10*3/uL — AB (ref 3.6–11.0)

## 2015-04-17 LAB — RPR: RPR: NONREACTIVE

## 2015-04-17 NOTE — Progress Notes (Signed)
Post Partum Day 1 Subjective: no complaints  Objective: Blood pressure 116/73, pulse 84, temperature 97.6 F (36.4 C), temperature source Oral, resp. rate 18, height 5\' 5"  (1.651 m), weight 200 lb (90.719 kg), last menstrual period 07/05/2014, SpO2 100 %.  Physical Exam:  General: alert, cooperative and appears stated age Lochia: appropriate Uterine Fundus: firm Incision: NA DVT Evaluation: No evidence of DVT seen on physical exam. Negative Homan's sign.   Recent Labs  04/17/15 0220 04/17/15 0633  HGB 10.5* 10.0*  HCT 30.0* 28.7*    Assessment/Plan: D/c foley and secondary IV, switch primary IV to SL, may ambulate, Plan for discharge tomorrow Infant feeding only Bottle    LOS: 2 days   Christy Schmidt,CNM 04/17/2015, 12:45 PM

## 2015-04-17 NOTE — Progress Notes (Signed)
Brown HumanM. Shambly CNM notified of CBC Results and VS at 0119 of 104/58, 88 and 20 as well as VS at 0310 of 101/44, 78 and 20. Also notified CNM that Pt.'s B/P's have been in the 115-116 range since arrival to Unit Syst. And Jenean Lindauias. In the 60's. Urinary output as well as Fundal Height and Lochial Flow reported. CNM stated that Pt.'s B/P's ran in the Low 100's during Office visits and to cont. To Monitor Pt. For now;no further orders. Will cont. To watch Pt. Closely.

## 2015-04-17 NOTE — Progress Notes (Signed)
HGB is 10.5, HCT is 30.0 and Plt.s are 162,000 one hour after the 4th of 4 PRBC Infused. Pt. Tolerated well. Her Fundus is Firm at U/E and Lochial flow totals at 115cc in the past 8 hours. Pt out put has been 1900cc. Pt. Denies c/o except abdominal cramping with movement. Positive Pedal pulses equal and strong with cap. Refill < 2 sec. Will cont. To monitor closely.

## 2015-04-18 LAB — RUBELLA ANTIBODY, IGM

## 2015-04-18 LAB — VARICELLA ZOSTER ANTIBODY, IGG

## 2015-04-18 MED ORDER — IBUPROFEN 800 MG PO TABS: 800.0000 mg | ORAL_TABLET | Freq: Four times a day (QID) | ORAL | Status: DC

## 2015-04-18 MED ORDER — IBUPROFEN 600 MG PO TABS: 600.0000 mg | ORAL_TABLET | Freq: Four times a day (QID) | ORAL | Status: DC

## 2015-04-18 MED ORDER — FUSION PLUS PO CAPS: 1.0000 | ORAL_CAPSULE | Freq: Every day | ORAL | Status: DC

## 2015-04-18 MED ORDER — MEDROXYPROGESTERONE ACETATE 150 MG/ML IM SUSP
150.0000 mg | Freq: Once | INTRAMUSCULAR | Status: AC
  Administered 2015-04-18: 150 mg via INTRAMUSCULAR
  Filled 2015-04-18: qty 1

## 2015-04-18 MED ORDER — MEDROXYPROGESTERONE ACETATE 150 MG/ML IM SUSP: 150.0000 mg | INTRAMUSCULAR | Status: DC

## 2015-04-18 NOTE — Discharge Summary (Signed)
Obstetric Discharge Summary Reason for Admission: induction of labor Prenatal Procedures: NST and ultrasound Intrapartum Procedures: spontaneous vaginal delivery Postpartum Procedures: transfusion 4u PRBC Complications-Operative and Postpartum: hemorrhage HEMOGLOBIN  Date Value Ref Range Status  04/17/2015 10.0* 12.0 - 16.0 g/dL Final   HGB  Date Value Ref Range Status  08/10/2013 12.7 12.0-16.0 g/dL Final   HCT  Date Value Ref Range Status  04/17/2015 28.7* 35.0 - 47.0 % Final  08/10/2013 39.1 35.0-47.0 % Final   HEMATOCRIT  Date Value Ref Range Status  02/22/2015 30.2* 34.0 - 46.6 % Final    Physical Exam:  General: alert, cooperative, appears stated age and fatigued Lochia: appropriate Uterine Fundus: firm Incision: NA DVT Evaluation: No evidence of DVT seen on physical exam. Negative Homan's sign.  Discharge Diagnoses: Term Pregnancy-delivered and postpartum hemorrhage  Discharge Information: Date: 04/18/2015 Activity: pelvic rest Diet: routine Medications: PNV, Ibuprofen and Iron Condition: stable Instructions: refer to practice specific booklet Discharge to: home   Newborn Data: Live born female  Birth Weight: 8 lb 11 oz (3940 g) APGAR: 8, 9  Home with mother.  Charlane Westry NIKE Shondell Fabel, CNM 04/18/2015, 10:07 AM

## 2015-04-18 NOTE — Progress Notes (Signed)
RX given for home use.  Verb u/o

## 2015-04-18 NOTE — Anesthesia Postprocedure Evaluation (Signed)
Anesthesia Post Note  Patient: Christy Schmidt  Procedure(s) Performed: * No procedures listed *  Patient location during evaluation: Mother Baby Anesthesia Type: Epidural Level of consciousness: awake and alert and oriented Pain management: pain level controlled Vital Signs Assessment: post-procedure vital signs reviewed and stable Respiratory status: spontaneous breathing Cardiovascular status: stable Postop Assessment: no headache Anesthetic complications: no    Last Vitals:  Filed Vitals:   04/17/15 2307 04/18/15 0320  BP: 111/65 107/70  Pulse: 70 88  Temp: 36.8 C 36.5 C  Resp: 20 20    Last Pain:  Filed Vitals:   04/18/15 0324  PainSc: 0-No pain                 Torrion Witter,  Alessandra BevelsJennifer M

## 2015-04-20 ENCOUNTER — Telehealth: Payer: Self-pay | Admitting: *Deleted

## 2015-04-20 NOTE — Telephone Encounter (Signed)
-----   Message from Purcell NailsMelody N Shambley, [place]M sent at 04/18/2015  5:55 PM EDT ----- Please let her know she needs both a rubella and varicella vaccine- to go to [facility] to get them within the next week

## 2015-04-20 NOTE — Telephone Encounter (Signed)
Notified pt she is planning on going to [facility] 04/21/2015

## 2015-07-05 ENCOUNTER — Ambulatory Visit: Admitting: Obstetrics and Gynecology

## 2015-08-18 ENCOUNTER — Emergency Department
Admission: EM | Admit: 2015-08-18 | Discharge: 2015-08-18 | Disposition: A | Discharge status: Home / Self Care | Payer: Medicaid Other | Attending: Emergency Medicine | Admitting: Emergency Medicine

## 2015-08-18 ENCOUNTER — Encounter: Payer: Self-pay | Admitting: Emergency Medicine

## 2015-08-18 DIAGNOSIS — N939 Abnormal uterine and vaginal bleeding, unspecified: Secondary | ICD-10-CM | POA: Insufficient documentation

## 2015-08-18 DIAGNOSIS — Z791 Long term (current) use of non-steroidal anti-inflammatories (NSAID): Secondary | ICD-10-CM | POA: Diagnosis not present

## 2015-08-18 LAB — URINALYSIS COMPLETE WITH MICROSCOPIC (ARMC ONLY)
Bilirubin Urine: NEGATIVE
Glucose, UA: NEGATIVE mg/dL
Ketones, ur: NEGATIVE mg/dL
Nitrite: NEGATIVE
PH: 6 (ref 5.0–8.0)
PROTEIN: 30 mg/dL — AB
Specific Gravity, Urine: 1.025 (ref 1.005–1.030)

## 2015-08-18 LAB — COMPREHENSIVE METABOLIC PANEL
ALBUMIN: 4.3 g/dL (ref 3.5–5.0)
ALK PHOS: 44 U/L (ref 38–126)
ALT: 22 U/L (ref 14–54)
ANION GAP: 7 (ref 5–15)
AST: 20 U/L (ref 15–41)
BUN: 13 mg/dL (ref 6–20)
CALCIUM: 9.2 mg/dL (ref 8.9–10.3)
CHLORIDE: 110 mmol/L (ref 101–111)
CO2: 24 mmol/L (ref 22–32)
CREATININE: 0.67 mg/dL (ref 0.44–1.00)
GFR calc non Af Amer: >60 mL/min (ref >60)
GLUCOSE: 94 mg/dL (ref 65–99)
Potassium: 3.6 mmol/L (ref 3.5–5.1)
SODIUM: 141 mmol/L (ref 135–145)
Total Bilirubin: 0.6 mg/dL (ref 0.3–1.2)
Total Protein: 7.2 g/dL (ref 6.5–8.1)

## 2015-08-18 LAB — CBC
HCT: 34.4 % — ABNORMAL LOW (ref 35.0–47.0)
HEMOGLOBIN: 12 g/dL (ref 12.0–16.0)
MCH: 29.4 pg (ref 26.0–34.0)
MCHC: 34.9 g/dL (ref 32.0–36.0)
MCV: 84.4 fL (ref 80.0–100.0)
Platelets: 207 10*3/uL (ref 150–440)
RBC: 4.07 MIL/uL (ref 3.80–5.20)
RDW: 14.3 % (ref 11.5–14.5)
WBC: 10 10*3/uL (ref 3.6–11.0)

## 2015-08-18 LAB — PREGNANCY, URINE: PREG TEST UR: NEGATIVE

## 2015-08-18 NOTE — ED Triage Notes (Signed)
Patient presents to the ED with lower abdominal pain and vaginal bleeding x 4 months.  Patient states bleeding and pain started when she had her baby.  Patient states she has not followed up with her Ob-Gyn.  Patient states over the past few weeks bleeding is increasing with patient changing pads about every 2-3 hours.  Patient states bleeding has been worse since she's been back at work.  Patient reports pain/pressure when she urinates.  Patient denies urinary frequency.

## 2015-08-18 NOTE — ED Provider Notes (Addendum)
[facility] Emergency Department Provider Note  ____________________________________________   I have reviewed the triage vital signs and the nursing notes.   HISTORY  Chief Complaint Vaginal Bleeding and Abdominal Pain    HPI Christy Schmidt is a 20 y.o. female who presents today complaining of vaginal bleeding for 4 months. She does not feel lightheaded. She goes through to sometimes 3 pads a day usually. Denies any significant pain. Does have a little bit of cramping sometimes. Denies fever or chills. Denies dysuria or flank pain or urinary frequency. Patient states that she would prefer to follow up with her OB rather than have a thing else done as long as her hemoglobin is okay. He has not yet called her OB/GYN about this.   *   Past Medical History:  Diagnosis Date  . ZOXWRUEA(540.9)     Patient Active Problem List   Diagnosis Date Noted  . Oligohydramnios in third trimester, antepartum 04/15/2015  . Oligohydramnios antepartum 04/15/2015  . Anemia affecting pregnancy 02/23/2015  . Migraine without aura and without status migrainosus, not intractable 05/22/2012  . Tension headache 05/22/2012    Past Surgical History:  Procedure Laterality Date  . 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 Order #: [id number] Class: Print  . Order #: [id number] Class: Print  . Order #: [id number] Class: Print  . Order #: [id number] Class: Historical Med    Allergies Review of patient's allergies indicates no known allergies.  Family History  Problem Relation Age of Onset  . Migraines Mother   . Depression Mother   . Migraines Maternal Grandmother   . Migraines Cousin     Social History Social History  Substance Use Topics  . Smoking status: Never Smoker  . Smokeless tobacco: Never Used  . Alcohol use No    Review of Systems Constitutional: No fever/chills Eyes: No visual changes. ENT:  No sore throat. No stiff neck no neck pain Cardiovascular: Denies chest pain. Respiratory: Denies shortness of breath. Gastrointestinal:   no vomiting.  No diarrhea.  No constipation. Genitourinary: Negative for dysuria. Musculoskeletal: Negative lower extremity swelling Skin: Negative for rash. Neurological: Negative for severe headaches, focal weakness or numbness. 10-point ROS otherwise negative.  ____________________________________________   PHYSICAL EXAM:  VITAL SIGNS: ED Triage Vitals  Enc Vitals Group     BP 08/18/15 1324 (!) 119/91     Pulse Rate 08/18/15 1324 95     Resp 08/18/15 1324 18     Temp 08/18/15 1324 98.8 F (37.1 C)     Temp Source 08/18/15 1324 Oral     SpO2 08/18/15 1324 100 %     Weight --      Height --      Head Circumference --      Peak Flow --      Pain Score 08/18/15 1329 5     Pain Loc --      Pain Edu? --      Excl. in GC? --     Constitutional: Alert and oriented. Well appearing and in no acute distress. Eyes: Conjunctivae are normal. PERRL. EOMI. Head: Atraumatic. Nose: No congestion/rhinnorhea. Mouth/Throat: Mucous membranes are moist.  Oropharynx non-erythematous. Neck: No stridor.   Nontender with no meningismus Cardiovascular: Normal rate, regular rhythm. Grossly normal heart sounds.  Good peripheral circulation. Respiratory: Normal respiratory effort.  No retractions. Lungs CTAB. Abdominal: Soft and nontender. No distention. No guarding no rebound Back:  There is no focal tenderness or step off.  there is no midline tenderness there are no lesions noted. there is no CVA tenderness Musculoskeletal: No lower extremity tenderness, no upper extremity tenderness. No joint effusions, no DVT signs strong distal pulses no edema Neurologic:  Normal speech and language. No gross focal neurologic deficits are appreciated.  Skin:  Skin is warm, dry and intact. No rash noted. Psychiatric: Mood and affect are normal. Speech and behavior are  normal.  ____________________________________________   LABS (all labs ordered are listed, but only abnormal results are displayed)  Labs Reviewed  CBC - Abnormal; Notable for the following:       Result Value   HCT 34.4 (*)    All other components within normal limits  URINALYSIS COMPLETEWITH MICROSCOPIC ([facility] ONLY) - Abnormal; Notable for the following:    Color, Urine YELLOW (*)    APPearance HAZY (*)    Hgb urine dipstick 3+ (*)    Protein, ur 30 (*)    Leukocytes, UA 1+ (*)    Bacteria, UA RARE (*)    Squamous Epithelial / LPF 6-30 (*)    All other components within normal limits  COMPREHENSIVE METABOLIC PANEL  PREGNANCY, URINE   ____________________________________________  EKG  I personally interpreted any EKGs ordered by me or triage  ____________________________________________  RADIOLOGY  I reviewed any imaging ordered by me or triage that were performed during my shift and, if possible, patient and/or family made aware of any abnormal findings. ____________________________________________   PROCEDURES  Procedure(s) performed: None  Procedures  Critical Care performed: None  ____________________________________________   INITIAL IMPRESSION / ASSESSMENT AND PLAN / ED COURSE  Pertinent labs & imaging results that were available during my care of the patient were reviewed by me and considered in my medical decision making (see chart for details).  A short 4 months of vaginal bleeding. Reassuringly, her hemoglobin is 2 points higher than it was when she was discharged after her uneventful vaginal birth. Low suspicion for retained products of conception after 4 months however I have offered her ultrasound and pelvic exam and she declines. She has to pick up her daughter. She states that she will call her OB later today. She understands limitations that her departure places upon me. She understands I cannot rule out retained products of conception or other  intrauterine or abdominal pathology could be causing her symptoms. However, she has reassuring vital signs reassuring blood work and this is been going on for 4 months and I feel that her choice on this issue is certainly to be respected. If she has new or worrisome symptoms she knows she must come back and she is very comfortable with this. Family are in the room. Patient therefore declines pelvic exam ultrasound or further workup. I offered to keep her here to talk to her OB/GYN but she would prefer not to stay. I've offered to check a pregnancy and she states that she knows she is not pregnant as she is tested it at home. Nonetheless I will add a urine pregnancy onto her lab in the lab that will take some time to come back. Given that the patient refuses any further care for me at this time and is requesting discharge we will discharge her. Patient is very pleasant about this, she is not confrontational she would just prefer not to be here at this time. She has been reasonably medically screened for this four-month problem and I feel comfortable that she will return  if she feels worse.  ----------------------------------------- 3:32 PM on 08/18/2015 -----------------------------------------  Of note, patient does not have any symptoms of UTI and this is a poor clean catch we'll send a urine culture but defer antibiotics at this time. ____________________________________________   FINAL CLINICAL IMPRESSION(S) / ED DIAGNOSES  Final diagnoses:  None      This chart was dictated using voice recognition software.  Despite best efforts to proofread,  errors can occur which can change meaning.      Jeanmarie Plant, MD 08/18/15 7829    Jeanmarie Plant, MD 08/18/15 [phone]

## 2015-08-19 LAB — URINE CULTURE

## 2015-08-23 ENCOUNTER — Ambulatory Visit (INDEPENDENT_AMBULATORY_CARE_PROVIDER_SITE_OTHER): Admitting: Obstetrics and Gynecology

## 2015-08-23 ENCOUNTER — Encounter: Payer: Self-pay | Admitting: Obstetrics and Gynecology

## 2015-08-23 VITALS — BP 92/62 | HR 115 | Ht 65.0 in | Wt 163.6 lb

## 2015-08-23 DIAGNOSIS — N938 Other specified abnormal uterine and vaginal bleeding: Secondary | ICD-10-CM | POA: Diagnosis not present

## 2015-08-23 DIAGNOSIS — N939 Abnormal uterine and vaginal bleeding, unspecified: Secondary | ICD-10-CM | POA: Diagnosis not present

## 2015-08-23 LAB — POCT URINALYSIS DIPSTICK
BILIRUBIN UA: NEGATIVE
Glucose, UA: NEGATIVE
KETONES UA: NEGATIVE
LEUKOCYTES UA: NEGATIVE
Nitrite, UA: NEGATIVE
PH UA: 6
Spec Grav, UA: 1.015
Urobilinogen, UA: 0.2

## 2015-08-23 NOTE — Patient Instructions (Signed)
Ovarian Cyst An ovarian cyst is a fluid-filled sac that forms on an ovary. The ovaries are small organs that produce eggs in women. Various types of cysts can form on the ovaries. Most are not cancerous. Many do not cause problems, and they often go away on their own. Some may cause symptoms and require treatment. Common types of ovarian cysts include:  Functional cysts--These cysts may occur every month during the menstrual cycle. This is normal. The cysts usually go away with the next menstrual cycle if the woman does not get pregnant. Usually, there are no symptoms with a functional cyst.  Endometrioma cysts--These cysts form from the tissue that lines the uterus. They are also called "chocolate cysts" because they become filled with blood that turns brown. This type of cyst can cause pain in the lower abdomen during intercourse and with your menstrual period.  Cystadenoma cysts--This type develops from the cells on the outside of the ovary. These cysts can get very big and cause lower abdomen pain and pain with intercourse. This type of cyst can twist on itself, cut off its blood supply, and cause severe pain. It can also easily rupture and cause a lot of pain.  Dermoid cysts--This type of cyst is sometimes found in both ovaries. These cysts may contain different kinds of body tissue, such as skin, teeth, hair, or cartilage. They usually do not cause symptoms unless they get very big.  Theca lutein cysts--These cysts occur when too much of a certain hormone (human chorionic gonadotropin) is produced and overstimulates the ovaries to produce an egg. This is most common after procedures used to assist with the conception of a baby (in vitro fertilization). CAUSES   Fertility drugs can cause a condition in which multiple large cysts are formed on the ovaries. This is called ovarian hyperstimulation syndrome.  A condition called polycystic ovary syndrome can cause hormonal imbalances that can lead to  nonfunctional ovarian cysts. SIGNS AND SYMPTOMS  Many ovarian cysts do not cause symptoms. If symptoms are present, they may include:  Pelvic pain or pressure.  Pain in the lower abdomen.  Pain during sexual intercourse.  Increasing girth (swelling) of the abdomen.  Abnormal menstrual periods.  Increasing pain with menstrual periods.  Stopping having menstrual periods without being pregnant. DIAGNOSIS  These cysts are commonly found during a routine or annual pelvic exam. Tests may be ordered to find out more about the cyst. These tests may include:  Ultrasound.  X-ray of the pelvis.  CT scan.  MRI.  Blood tests. TREATMENT  Many ovarian cysts go away on their own without treatment. Your health care provider may want to check your cyst regularly for 2-3 months to see if it changes. For women in menopause, it is particularly important to monitor a cyst closely because of the higher rate of ovarian cancer in menopausal women. When treatment is needed, it may include any of the following:  A procedure to drain the cyst (aspiration). This may be done using a long needle and ultrasound. It can also be done through a laparoscopic procedure. This involves using a thin, lighted tube with a tiny camera on the end (laparoscope) inserted through a small incision.  Surgery to remove the whole cyst. This may be done using laparoscopic surgery or an open surgery involving a larger incision in the lower abdomen.  Hormone treatment or birth control pills. These methods are sometimes used to help dissolve a cyst. HOME CARE INSTRUCTIONS   Only take over-the-counter   or prescription medicines as directed by your health care provider.  Follow up with your health care provider as directed.  Get regular pelvic exams and Pap tests. SEEK MEDICAL CARE IF:   Your periods are late, irregular, or painful, or they stop.  Your pelvic pain or abdominal pain does not go away.  Your abdomen becomes  larger or swollen.  You have pressure on your bladder or trouble emptying your bladder completely.  You have pain during sexual intercourse.  You have feelings of fullness, pressure, or discomfort in your stomach.  You lose weight for no apparent reason.  You feel generally ill.  You become constipated.  You lose your appetite.  You develop acne.  You have an increase in body and facial hair.  You are gaining weight, without changing your exercise and eating habits.  You think you are pregnant. SEEK IMMEDIATE MEDICAL CARE IF:   You have increasing abdominal pain.  You feel sick to your stomach (nauseous), and you throw up (vomit).  You develop a fever that comes on suddenly.  You have abdominal pain during a bowel movement.  Your menstrual periods become heavier than usual. MAKE SURE YOU:  Understand these instructions.  Will watch your condition.  Will get help right away if you are not doing well or get worse.   This information is not intended to replace advice given to you by your health care provider. Make sure you discuss any questions you have with your health care provider.   Document Released: 01/08/2005 Document Revised: 01/13/2013 Document Reviewed: 09/15/2012 Elsevier Interactive Patient Education [employer]    Endometriosis Endometriosis is a condition in which the tissue that lines the uterus (endometrium) grows outside of its normal location. The tissue may grow in many locations close to the uterus, but it commonly grows on the ovaries, fallopian tubes, vagina, or bowel. Because the uterus expels, or sheds, its lining every menstrual cycle, there is bleeding wherever the endometrial tissue is located. This can cause pain because blood is irritating to tissues not normally exposed to it.  CAUSES  The cause of endometriosis is not known.  SIGNS AND SYMPTOMS  Often, there are no symptoms. When symptoms are present, they can vary with the  location of the displaced tissue. Various symptoms can occur at different times. Although symptoms occur mainly during a woman's menstrual period, they can also occur midcycle and usually stop with menopause. Some people may go months with no symptoms at all. Symptoms may include:   Back or abdominal pain.   Heavier bleeding during periods.   Pain during intercourse.   Painful bowel movements.   Infertility. DIAGNOSIS  Your health care provider will do a physical exam and ask about your symptoms. Various tests may be done, such as:   Blood tests and urine tests. These are done to help rule out other problems.   Ultrasound. This test is done to look for abnormal tissue.   An X-ray of the lower bowel (barium enema).  Laparoscopy. In this procedure, a thin, lighted tube with a tiny camera on the end (laparoscope) is inserted into your abdomen. This helps your health care provider look for abnormal tissue to confirm the diagnosis. The health care provider may also remove a small piece of tissue (biopsy) from any abnormal tissue found. This tissue sample can then be sent to a lab so it can be looked at under a microscope. TREATMENT  Treatment will vary and may include:  Medicines to relieve pain. Nonsteroidal anti-inflammatory drugs (NSAIDs) are a type of pain medicine that can help to relieve the pain caused by endometriosis.  Hormonal therapy. When using hormonal therapy, periods are eliminated. This eliminates the monthly exposure to blood by the displaced endometrial tissue.   Surgery. Surgery may sometimes be done to remove the abnormal endometrial tissue. In severe cases, surgery may be done to remove the fallopian tubes, uterus, and ovaries (hysterectomy). HOME CARE INSTRUCTIONS   Take all medicines as directed by your health care provider. Do not take aspirin because it may increase bleeding when you are not on hormonal therapy.   Avoid activities that produce pain,  including sexual activity. SEEK MEDICAL CARE IF:  You have pelvic pain before, after, or during your periods.  You have pelvic pain between periods that gets worse during your period.  You have pelvic pain during or after sex.  You have pelvic pain with bowel movements or urination, especially during your period.  You have problems getting pregnant.  You have a fever. SEEK IMMEDIATE MEDICAL CARE IF:   Your pain is severe and is not responding to pain medicine.   You have severe nausea and vomiting, or you cannot keep foods down.   You have pain that is limited to the right lower part of your abdomen.   You have swelling or increasing pain in your abdomen.   You see blood in your stool.  MAKE SURE YOU:   Understand these instructions.  Will watch your condition.  Will get help right away if you are not doing well or get worse.   This information is not intended to replace advice given to you by your health care provider. Make sure you discuss any questions you have with your health care provider.   Document Released: 01/06/2000 Document Revised: 01/29/2014 Document Reviewed: 09/05/2012 Elsevier Interactive Patient Education [employer]

## 2015-08-23 NOTE — Progress Notes (Signed)
Subjective:     Patient ID: Christy Schmidt, female   DOB: [birth year], 19 y.o.   MRN: [id number]  HPI Here for persistant and heavy vaginal bleeding since May. Reports irregular and sometimes heavy bleeding after delivery of infant in March, never seen postpartum. Did receive depo before leaving the hospital but didn't get 2nd shot due to bleeding. To not had a PPH and received 4 units PRBC after delivery.  Seen in ED 6 days ago for pelvic pain that radiates to back and heavy vaginal bleeding, CBC normal and declined pelvic exam at that time. States bleeding persists since then and is still having pain, has nmissed work a few days and feels awful. Denies any known h/o ovarian cyst, bleeding disorders or endometriosis.  Review of Systems See above    Objective:   Physical Exam A&O x4  well groomed female Blood pressure 92/62, pulse (!) 115, height 5\' 5"  (1.651 m), weight 163 lb 9.6 oz (74.2 kg), last menstrual period 07/05/2014, not currently breastfeeding. Abdomen soft and non-tender Pelvic exam: VAGINA: normal appearing vagina with normal color and discharge, no lesions, CERVIX: multiparous os, copious amounts dark red blood noted, UTERUS: uterus is normal size, shape, consistency and nontender, although difficult to palpate due to garding, ADNEXA: tenderness left.    Assessment:     DUB S/p PPH and 4 units transfused Pelvic pain     Plan:     Labs obtained and will treat accordingly Counseled at length about causes of DUB- ovarian cyst, anemia, thyroid, tumors, bleeding disorders, endometriosis.  Will have her start on a pack of OCPs (Taytulla given) and RTC for next opening for u/s and 2 weeks to recheck.  Lonn Im [place], CNM

## 2015-08-24 ENCOUNTER — Other Ambulatory Visit

## 2015-08-24 ENCOUNTER — Other Ambulatory Visit: Payer: Self-pay | Admitting: Obstetrics and Gynecology

## 2015-08-24 DIAGNOSIS — N939 Abnormal uterine and vaginal bleeding, unspecified: Secondary | ICD-10-CM

## 2015-08-24 LAB — THYROID PANEL WITH TSH
Free Thyroxine Index: 2 (ref 1.2–4.9)
T3 UPTAKE RATIO: 29 % (ref 24–39)
T4, Total: 6.8 ug/dL (ref 4.5–12.0)
TSH: 0.61 u[IU]/mL (ref 0.450–4.500)

## 2015-08-24 LAB — VITAMIN D 25 HYDROXY (VIT D DEFICIENCY, FRACTURES): Vit D, 25-Hydroxy: 30.4 ng/mL (ref 30.0–100.0)

## 2015-08-24 LAB — PROTIME-INR
INR: 2.1 — ABNORMAL HIGH (ref 0.8–1.2)
Prothrombin Time: 21.4 s — ABNORMAL HIGH (ref 9.1–12.0)

## 2015-08-24 LAB — FSH/LH
FSH: 7.6 m[IU]/mL
LH: 12.4 m[IU]/mL

## 2015-08-24 LAB — APTT: APTT: 41 s — AB (ref 24–33)

## 2015-08-25 ENCOUNTER — Ambulatory Visit (INDEPENDENT_AMBULATORY_CARE_PROVIDER_SITE_OTHER)

## 2015-08-25 DIAGNOSIS — N939 Abnormal uterine and vaginal bleeding, unspecified: Secondary | ICD-10-CM | POA: Diagnosis not present

## 2015-08-30 ENCOUNTER — Other Ambulatory Visit: Payer: Self-pay | Admitting: Obstetrics and Gynecology

## 2015-08-30 DIAGNOSIS — N938 Other specified abnormal uterine and vaginal bleeding: Secondary | ICD-10-CM

## 2015-08-30 DIAGNOSIS — R791 Abnormal coagulation profile: Secondary | ICD-10-CM

## 2015-09-12 ENCOUNTER — Ambulatory Visit: Admitting: Obstetrics and Gynecology

## 2015-09-13 ENCOUNTER — Ambulatory Visit: Admitting: Obstetrics and Gynecology

## 2015-09-18 NOTE — Progress Notes (Deleted)
[facility]  Telephone:(336) [phone] [phone]) [phone]  ID: Job Foundsamisha A Williams OB: Jan 12, 1996  MR#: [id number]  ION#:[id number]#:[id number]  Patient Care Team: No Pcp Per Patient as PCP - General (General Practice)  CHIEF COMPLAINT: ***  INTERVAL HISTORY: ***  REVIEW OF SYSTEMS:   ROS  As per HPI. Otherwise, a complete review of systems is negatve.  PAST MEDICAL HISTORY: Past Medical History:  Diagnosis Date  . Headache(784.0)     PAST SURGICAL HISTORY: Past Surgical History:  Procedure Laterality Date  . OTHER SURGICAL HISTORY Right 2010   Bone Removed on Right Hand 3rd Digit    FAMILY HISTORY: Family History  Problem Relation Age of Onset  . Migraines Mother   . Depression Mother   . Migraines Maternal Grandmother   . Migraines Cousin        ADVANCED DIRECTIVES (Y/N):  N   HEALTH MAINTENANCE: Social History  Substance Use Topics  . Smoking status: Never Smoker  . Smokeless tobacco: Never Used  . Alcohol use No     Colonoscopy:  PAP:  Bone density:  Lipid panel:  No Known Allergies  Current Outpatient Prescriptions  Medication Sig Dispense Refill  . ibuprofen (ADVIL,MOTRIN) 800 MG tablet Take 1 tablet (800 mg total) by mouth every 6 (six) hours. (Patient not taking: Reported on 08/23/2015) 50 tablet 1  . Iron-FA-B Cmp-C-Biot-Probiotic (FUSION PLUS) CAPS Take 1 capsule by mouth daily. (Patient not taking: Reported on 08/23/2015) 60 capsule 1  . medroxyPROGESTERone (DEPO-PROVERA) 150 MG/ML injection Inject 1 mL (150 mg total) into the muscle every 3 (three) months. (Patient not taking: Reported on 08/23/2015) 1 mL 3  . Prenatal Vit-Fe Fumarate-FA (PRENATAL MULTIVITAMIN) TABS tablet Take 1 tablet by mouth daily at 12 noon.     No current facility-administered medications for this visit.     OBJECTIVE: There were no vitals filed for this visit.   There is no height or weight on file to calculate BMI.    ECOG FS:{CHL ONC [id number]:[phone]}  General:  Well-developed, well-nourished, no acute distress. Eyes: Pink conjunctiva, anicteric sclera. HEENT: Normocephalic, moist mucous membranes, clear oropharnyx. Lungs: Clear to auscultation bilaterally. Heart: Regular rate and rhythm. No rubs, murmurs, or gallops. Abdomen: Soft, nontender, nondistended. No organomegaly noted, normoactive bowel sounds. Musculoskeletal: No edema, cyanosis, or clubbing. Neuro: Alert, answering all questions appropriately. Cranial nerves grossly intact. Skin: No rashes or petechiae noted. Psych: Normal affect. Lymphatics: No cervical, calvicular, axillary or inguinal LAD.   LAB RESULTS:  Lab Results  Component Value Date   NA 141 08/18/2015   K 3.6 08/18/2015   CL 110 08/18/2015   CO2 24 08/18/2015   GLUCOSE 94 08/18/2015   BUN 13 08/18/2015   CREATININE 0.67 08/18/2015   CALCIUM 9.2 08/18/2015   PROT 7.2 08/18/2015   ALBUMIN 4.3 08/18/2015   AST 20 08/18/2015   ALT 22 08/18/2015   ALKPHOS 44 08/18/2015   BILITOT 0.6 08/18/2015   GFRNONAA >60 08/18/2015   GFRAA >60 08/18/2015    Lab Results  Component Value Date   WBC 10.0 08/18/2015   NEUTROABS 13.2 (H) 04/17/2015   HGB 12.0 08/18/2015   HCT 34.4 (L) 08/18/2015   MCV 84.4 08/18/2015   PLT 207 08/18/2015     STUDIES: [place] Transvaginal Non-ob  Result Date: 08/30/2015 ULTRASOUND REPORT Location: [facility] Date of Service: 08/25/15 Indications: AUB Findings: The uterus measures 6.6 x 3.2 x 4.5 cm and appears WNL. Echo texture is homogenous without evidence of focal masses.  The Endometrium is tri-layered and appears to be in the periovulatory phase and measures 2.8 mm. Right Ovary measures 4.0 x 2.3 x 2.1 cm, multiple follicles seen, otherwise, WNL. Left Ovary measures 2.9 x 1.8 x 2.1 cm, multiple follicles, otherwise, WNL. Survey of the adnexa demonstrates no adnexal masses. There is no free fluid in the cul de sac. Impression: 1. Normal appearing pelvic ultrasound. Recommendations:  1.Clinical correlation with the patient's History and Physical Exam. Elliott,Teresa, RT Scan reviewed and agree with findings Harlow Mares, CNM    ASSESSMENT:   PLAN:    Patient expressed understanding and was in agreement with this plan. She also understands that She can call clinic at any time with any questions, concerns, or complaints.   No matching staging information was found for the patient.  Jeralyn Ruths, MD   09/18/2015 11:55 PM

## 2015-09-19 ENCOUNTER — Inpatient Hospital Stay: Payer: Self-pay | Attending: Oncology | Admitting: Oncology

## 2015-10-05 ENCOUNTER — Ambulatory Visit: Admitting: Obstetrics and Gynecology

## 2015-11-19 ENCOUNTER — Emergency Department
Admission: EM | Admit: 2015-11-19 | Discharge: 2015-11-19 | Disposition: A | Discharge status: Home / Self Care | Payer: Self-pay | Attending: Emergency Medicine | Admitting: Emergency Medicine

## 2015-11-19 ENCOUNTER — Encounter: Payer: Self-pay | Admitting: Emergency Medicine

## 2015-11-19 ENCOUNTER — Emergency Department: Payer: Self-pay

## 2015-11-19 DIAGNOSIS — N23 Unspecified renal colic: Secondary | ICD-10-CM | POA: Insufficient documentation

## 2015-11-19 DIAGNOSIS — F17200 Nicotine dependence, unspecified, uncomplicated: Secondary | ICD-10-CM | POA: Insufficient documentation

## 2015-11-19 LAB — COMPREHENSIVE METABOLIC PANEL
ALBUMIN: 4.3 g/dL (ref 3.5–5.0)
ALK PHOS: 48 U/L (ref 38–126)
ALT: 17 U/L (ref 14–54)
ANION GAP: 7 (ref 5–15)
AST: 22 U/L (ref 15–41)
BILIRUBIN TOTAL: 0.2 mg/dL — AB (ref 0.3–1.2)
BUN: 9 mg/dL (ref 6–20)
CO2: 25 mmol/L (ref 22–32)
Calcium: 9.3 mg/dL (ref 8.9–10.3)
Chloride: 108 mmol/L (ref 101–111)
Creatinine, Ser: 0.76 mg/dL (ref 0.44–1.00)
GFR calc Af Amer: >60 mL/min (ref >60)
GFR calc non Af Amer: >60 mL/min (ref >60)
GLUCOSE: 120 mg/dL — AB (ref 65–99)
POTASSIUM: 3.8 mmol/L (ref 3.5–5.1)
SODIUM: 140 mmol/L (ref 135–145)
TOTAL PROTEIN: 7.7 g/dL (ref 6.5–8.1)

## 2015-11-19 LAB — URINALYSIS COMPLETE WITH MICROSCOPIC (ARMC ONLY)
Bilirubin Urine: NEGATIVE
GLUCOSE, UA: NEGATIVE mg/dL
Ketones, ur: NEGATIVE mg/dL
NITRITE: NEGATIVE
PROTEIN: 30 mg/dL — AB
Specific Gravity, Urine: 1.017 (ref 1.005–1.030)
pH: 6 (ref 5.0–8.0)

## 2015-11-19 LAB — CBC
HEMATOCRIT: 37.6 % (ref 35.0–47.0)
HEMOGLOBIN: 12.8 g/dL (ref 12.0–16.0)
MCH: 29.7 pg (ref 26.0–34.0)
MCHC: 34 g/dL (ref 32.0–36.0)
MCV: 87.4 fL (ref 80.0–100.0)
Platelets: 251 10*3/uL (ref 150–440)
RBC: 4.3 MIL/uL (ref 3.80–5.20)
RDW: 13.4 % (ref 11.5–14.5)
WBC: 6.7 10*3/uL (ref 3.6–11.0)

## 2015-11-19 LAB — PREGNANCY, URINE: PREG TEST UR: NEGATIVE

## 2015-11-19 LAB — LIPASE, BLOOD: Lipase: 21 U/L (ref 11–51)

## 2015-11-19 MED ORDER — FENTANYL CITRATE (PF) 100 MCG/2ML IJ SOLN
50.0000 ug | INTRAMUSCULAR | Status: DC | PRN
  Administered 2015-11-19: 50 ug via INTRAVENOUS
  Filled 2015-11-19: qty 2

## 2015-11-19 MED ORDER — TAMSULOSIN HCL 0.4 MG PO CAPS: 0.4000 mg | ORAL_CAPSULE | Freq: Every day | ORAL | 0 refills | Status: DC

## 2015-11-19 MED ORDER — ONDANSETRON HCL 4 MG PO TABS: 4.0000 mg | ORAL_TABLET | Freq: Three times a day (TID) | ORAL | 0 refills | Status: DC | PRN

## 2015-11-19 MED ORDER — OXYCODONE-ACETAMINOPHEN 5-325 MG PO TABS: 1.0000 | ORAL_TABLET | Freq: Four times a day (QID) | ORAL | 0 refills | Status: DC | PRN

## 2015-11-19 MED ORDER — KETOROLAC TROMETHAMINE 30 MG/ML IJ SOLN
INTRAMUSCULAR | Status: AC
  Administered 2015-11-19: 30 mg via INTRAVENOUS
  Filled 2015-11-19: qty 1

## 2015-11-19 MED ORDER — KETOROLAC TROMETHAMINE 30 MG/ML IJ SOLN
30.0000 mg | Freq: Once | INTRAMUSCULAR | Status: AC
  Administered 2015-11-19: 30 mg via INTRAVENOUS

## 2015-11-19 MED ORDER — ONDANSETRON HCL 4 MG/2ML IJ SOLN
4.0000 mg | Freq: Once | INTRAMUSCULAR | Status: AC
  Administered 2015-11-19: 4 mg via INTRAVENOUS
  Filled 2015-11-19: qty 2

## 2015-11-19 NOTE — ED Notes (Signed)
RN in room to check on patient; patient states that pain is much better.

## 2015-11-19 NOTE — ED Notes (Addendum)
RN in room to answer call bell; patient's significant other who was in the room immediately became confrontational and began stating that we were not doing anything for the patient and that we weren't going to do anything but tell her she was fine and send her home. RN told him that we were trying to help patient and that I was coming in to give her medication. He then stated that patient had been laying in the room for a "while" and we had not been doing anything for her, RN told him that patient had only been roomed for approximately 15 minutes and that I was bringing pain medication in for her now. I explained that we were very busy today and our providers were seeing patients as quickly as they could. Patients significant other then stated that we needed to have more providers on staff to see patients. RN informed patients significant other that if he continued to be confrontational that he would be asked to wait in the lobby.

## 2015-11-19 NOTE — ED Notes (Signed)
Pt dry heaving and unable to sit due to pain. Pt in subwait. Medication given. Pt has family member with her.

## 2015-11-19 NOTE — ED Triage Notes (Signed)
Patient comes POV in wheelchair c/o RLQ radiating to right flank. Patient is crying and guarding right flank. Patient c/o nausea as well. Last ate at 1900 yesterday. Patient is A&O x4. Vomiting in triage.

## 2015-11-19 NOTE — ED Notes (Signed)
RN in room to check on patient. Patient states that she is still pain free at this time

## 2015-11-19 NOTE — ED Provider Notes (Signed)
[facility] Emergency Department Provider Note  ____________________________________________   I have reviewed the triage vital signs and the nursing notes.   HISTORY  Chief Complaint Abdominal Pain    HPI Christy Schmidt is a 20 y.o. female He states that she has sudden onset right flank pain this morning radiating towards her groin. She is not pregnant. She denies fever or chills. She did vomit. Pain is sharp. Comes and goes waxes and wanes.     Past Medical History:  Diagnosis Date  . ZOXWRUEA(540.9Headache(784.0)     Patient Active Problem List   Diagnosis Date Noted  . Migraine without aura and without status migrainosus, not intractable 05/22/2012    Past Surgical History:  Procedure Laterality Date  . OTHER SURGICAL HISTORY Right 2010   Bone Removed on Right Hand 3rd Digit    Prior to Admission medications   Medication Sig Start Date End Date Taking? Authorizing Provider  ibuprofen (ADVIL,MOTRIN) 800 MG tablet Take 1 tablet (800 mg total) by mouth every 6 (six) hours. Patient not taking: Reported on 08/23/2015 04/18/15   Melody N Shambley, CNM  Iron-FA-B Cmp-C-Biot-Probiotic (FUSION PLUS) CAPS Take 1 capsule by mouth daily. Patient not taking: Reported on 08/23/2015 04/18/15   Melody N Shambley, CNM  medroxyPROGESTERone (DEPO-PROVERA) 150 MG/ML injection Inject 1 mL (150 mg total) into the muscle every 3 (three) months. Patient not taking: Reported on 08/23/2015 04/18/15   Melody Suzan NailerN Shambley, CNM  Prenatal Vit-Fe Fumarate-FA (PRENATAL MULTIVITAMIN) TABS tablet Take 1 tablet by mouth daily at 12 noon.    Historical Provider, MD    Allergies Review of patient's allergies indicates no known allergies.  Family History  Problem Relation Age of Onset  . Migraines Mother   . Depression Mother   . Migraines Maternal Grandmother   . Migraines Cousin     Social History Social History  Substance Use Topics  . Smoking status: Current Every Day Smoker  .  Smokeless tobacco: Never Used  . Alcohol use No    Review of Systems Constitutional: No fever/chills Eyes: No visual changes. ENT: No sore throat. No stiff neck no neck pain Cardiovascular: Denies chest pain. Respiratory: Denies shortness of breath. Gastrointestinal:   no vomiting.  No diarrhea.  No constipation. Genitourinary: Negative for dysuria. Musculoskeletal: Negative lower extremity swelling Skin: Negative for rash. Neurological: Negative for severe headaches, focal weakness or numbness. 10-point ROS otherwise negative.  ____________________________________________   PHYSICAL EXAM:  VITAL SIGNS: ED Triage Vitals  Enc Vitals Group     BP 11/19/15 1056 (!) 145/100     Pulse Rate 11/19/15 1056 85     Resp 11/19/15 1056 20     Temp --      Temp src --      SpO2 11/19/15 1056 100 %     Weight 11/19/15 1110 150 lb (68 kg)     Height 11/19/15 1110 5\' 6"  (1.676 m)     Head Circumference --      Peak Flow --      Pain Score 11/19/15 1110 10     Pain Loc --      Pain Edu? --      Excl. in GC? --     Constitutional: Alert and oriented. Well appearing and in no acute distress. Eyes: Conjunctivae are normal. PERRL. EOMI. Head: Atraumatic. Nose: No congestion/rhinnorhea. Mouth/Throat: Mucous membranes are moist.  Oropharynx non-erythematous. Neck: No stridor.   Nontender with no meningismus Cardiovascular: Normal rate, regular rhythm.  Grossly normal heart sounds.  Good peripheral circulation. Respiratory: Normal respiratory effort.  No retractions. Lungs CTAB. Abdominal: Soft and nontender. No distention. No guarding no rebound Back:  There is no focal tenderness or step off.  there is no midline tenderness there are no lesions noted. there ositive right CVA tenderness Musculoskeletal: No lower extremity tenderness, no upper extremity tenderness. No joint effusions, no DVT signs strong distal pulses no edema Neurologic:  Normal speech and language. No gross focal  neurologic deficits are appreciated.  Skin:  Skin is warm, dry and intact. No rash noted. Psychiatric: Mood and affect are normal. Speech and behavior are normal.  ____________________________________________   LABS (all labs ordered are listed, but only abnormal results are displayed)  Labs Reviewed  COMPREHENSIVE METABOLIC PANEL - Abnormal; Notable for the following:       Result Value   Glucose, Bld 120 (*)    Total Bilirubin 0.2 (*)    All other components within normal limits  URINALYSIS COMPLETEWITH MICROSCOPIC ([facility] ONLY) - Abnormal; Notable for the following:    Color, Urine YELLOW (*)    APPearance CLOUDY (*)    Hgb urine dipstick 3+ (*)    Protein, ur 30 (*)    Leukocytes, UA TRACE (*)    Bacteria, UA RARE (*)    Squamous Epithelial / LPF 6-30 (*)    All other components within normal limits  LIPASE, BLOOD  CBC  PREGNANCY, URINE   ____________________________________________  EKG  I personally interpreted any EKGs ordered by me or triage  ____________________________________________  RADIOLOGY  I reviewed any imaging ordered by me or triage that were performed during my shift and, if possible, patient and/or family made aware of any abnormal findings. ____________________________________________   PROCEDURES  Procedure(s) performed: None  Procedures  Critical Care performed: None  ____________________________________________   INITIAL IMPRESSION / ASSESSMENT AND PLAN / ED COURSE  Pertinent labs & imaging results that were available during my care of the patient were reviewed by me and considered in my medical decision making (see chart for details).  Patient with right sided flank pain and hematuria no history of kidney stones we'll obtain imaging to rule that out. Otherwise, at this time she is well-appearing. Less likely is PID, or pyelonephritis.  Clinical Course   ____________________________________________   FINAL CLINICAL  IMPRESSION(S) / ED DIAGNOSES  Final diagnoses:  None      This chart was dictated using voice recognition software.  Despite best efforts to proofread,  errors can occur which can change meaning.      Jeanmarie PlantJames A Sylar Voong, MD 11/19/15 1340

## 2016-04-27 ENCOUNTER — Emergency Department
Admission: EM | Admit: 2016-04-27 | Discharge: 2016-04-27 | Disposition: A | Discharge status: Home / Self Care | Payer: Self-pay | Attending: Emergency Medicine | Admitting: Emergency Medicine

## 2016-04-27 ENCOUNTER — Encounter: Payer: Self-pay | Admitting: Emergency Medicine

## 2016-04-27 ENCOUNTER — Emergency Department: Payer: Self-pay

## 2016-04-27 DIAGNOSIS — R102 Pelvic and perineal pain: Secondary | ICD-10-CM

## 2016-04-27 DIAGNOSIS — F17200 Nicotine dependence, unspecified, uncomplicated: Secondary | ICD-10-CM | POA: Insufficient documentation

## 2016-04-27 DIAGNOSIS — J101 Influenza due to other identified influenza virus with other respiratory manifestations: Secondary | ICD-10-CM | POA: Insufficient documentation

## 2016-04-27 DIAGNOSIS — N730 Acute parametritis and pelvic cellulitis: Secondary | ICD-10-CM | POA: Insufficient documentation

## 2016-04-27 DIAGNOSIS — J111 Influenza due to unidentified influenza virus with other respiratory manifestations: Secondary | ICD-10-CM

## 2016-04-27 DIAGNOSIS — Z79899 Other long term (current) drug therapy: Secondary | ICD-10-CM | POA: Insufficient documentation

## 2016-04-27 LAB — COMPREHENSIVE METABOLIC PANEL
ALBUMIN: 4.2 g/dL (ref 3.5–5.0)
ALK PHOS: 43 U/L (ref 38–126)
ALT: 18 U/L (ref 14–54)
AST: 21 U/L (ref 15–41)
Anion gap: 7 (ref 5–15)
BILIRUBIN TOTAL: 1.1 mg/dL (ref 0.3–1.2)
BUN: 5 mg/dL — AB (ref 6–20)
CO2: 27 mmol/L (ref 22–32)
CREATININE: 0.75 mg/dL (ref 0.44–1.00)
Calcium: 9.1 mg/dL (ref 8.9–10.3)
Chloride: 103 mmol/L (ref 101–111)
GFR calc Af Amer: >60 mL/min (ref >60)
GLUCOSE: 109 mg/dL — AB (ref 65–99)
POTASSIUM: 3 mmol/L — AB (ref 3.5–5.1)
Sodium: 137 mmol/L (ref 135–145)
TOTAL PROTEIN: 8 g/dL (ref 6.5–8.1)

## 2016-04-27 LAB — WET PREP, GENITAL
Clue Cells Wet Prep HPF POC: NONE SEEN
Sperm: NONE SEEN
Trich, Wet Prep: NONE SEEN
YEAST WET PREP: NONE SEEN

## 2016-04-27 LAB — URINALYSIS, COMPLETE (UACMP) WITH MICROSCOPIC
BILIRUBIN URINE: NEGATIVE
Bacteria, UA: NONE SEEN
GLUCOSE, UA: NEGATIVE mg/dL
Hgb urine dipstick: NEGATIVE
KETONES UR: 20 mg/dL — AB
Nitrite: NEGATIVE
PH: 6 (ref 5.0–8.0)
PROTEIN: 30 mg/dL — AB
RBC / HPF: NONE SEEN RBC/hpf (ref 0–5)
SPECIFIC GRAVITY, URINE: 1.019 (ref 1.005–1.030)

## 2016-04-27 LAB — CBC WITH DIFFERENTIAL/PLATELET
Basophils Absolute: 0 10*3/uL (ref 0–0.1)
Basophils Relative: 0 %
EOS ABS: 0 10*3/uL (ref 0–0.7)
EOS PCT: 0 %
HCT: 39.8 % (ref 35.0–47.0)
Hemoglobin: 13.3 g/dL (ref 12.0–16.0)
LYMPHS PCT: 13 %
Lymphs Abs: 0.8 10*3/uL — ABNORMAL LOW (ref 1.0–3.6)
MCH: 28.9 pg (ref 26.0–34.0)
MCHC: 33.4 g/dL (ref 32.0–36.0)
MCV: 86.5 fL (ref 80.0–100.0)
MONO ABS: 0.5 10*3/uL (ref 0.2–0.9)
Monocytes Relative: 8 %
Neutro Abs: 5.1 10*3/uL (ref 1.4–6.5)
Neutrophils Relative %: 79 %
PLATELETS: 191 10*3/uL (ref 150–440)
RBC: 4.6 MIL/uL (ref 3.80–5.20)
RDW: 13.8 % (ref 11.5–14.5)
WBC: 6.4 10*3/uL (ref 3.6–11.0)

## 2016-04-27 LAB — CHLAMYDIA/NGC RT PCR (ARMC ONLY)
Chlamydia Tr: NOT DETECTED
N GONORRHOEAE: NOT DETECTED

## 2016-04-27 LAB — INFLUENZA PANEL BY PCR (TYPE A & B)
INFLAPCR: POSITIVE — AB
Influenza B By PCR: NEGATIVE

## 2016-04-27 LAB — POCT PREGNANCY, URINE: PREG TEST UR: NEGATIVE

## 2016-04-27 MED ORDER — LIDOCAINE HCL (PF) 1 % IJ SOLN
INTRAMUSCULAR | Status: AC
  Filled 2016-04-27: qty 5

## 2016-04-27 MED ORDER — DOXYCYCLINE HYCLATE 100 MG PO CAPS: 100.0000 mg | ORAL_CAPSULE | Freq: Two times a day (BID) | ORAL | 0 refills | Status: DC

## 2016-04-27 MED ORDER — DEXTROSE 5 % IV SOLN: 1.0000 g | Freq: Once | INTRAVENOUS | Status: DC

## 2016-04-27 MED ORDER — DOXYCYCLINE HYCLATE 100 MG PO TABS
100.0000 mg | ORAL_TABLET | Freq: Once | ORAL | Status: AC
  Administered 2016-04-27: 100 mg via ORAL
  Filled 2016-04-27: qty 1

## 2016-04-27 MED ORDER — KETOROLAC TROMETHAMINE 30 MG/ML IJ SOLN
30.0000 mg | Freq: Once | INTRAMUSCULAR | Status: AC
  Administered 2016-04-27: 30 mg via INTRAVENOUS
  Filled 2016-04-27: qty 1

## 2016-04-27 MED ORDER — ONDANSETRON HCL 4 MG/2ML IJ SOLN
INTRAMUSCULAR | Status: AC
  Filled 2016-04-27: qty 2

## 2016-04-27 MED ORDER — DEXTROSE 5 % IV SOLN: 250.0000 mg | Freq: Once | INTRAVENOUS | Status: DC

## 2016-04-27 MED ORDER — CEFTRIAXONE SODIUM-DEXTROSE 1-3.74 GM-% IV SOLR
1.0000 g | Freq: Once | INTRAVENOUS | Status: AC
Start: 2016-04-27 — End: 2016-04-27
  Administered 2016-04-27: 1 g via INTRAVENOUS

## 2016-04-27 MED ORDER — SODIUM CHLORIDE 0.9 % IV SOLN
1000.0000 mL | Freq: Once | INTRAVENOUS | Status: AC
  Administered 2016-04-27: 1000 mL via INTRAVENOUS

## 2016-04-27 MED ORDER — CEFTRIAXONE SODIUM 250 MG IJ SOLR
250.0000 mg | Freq: Once | INTRAMUSCULAR | Status: DC
Start: 2016-04-27 — End: 2016-04-27
  Filled 2016-04-27: qty 250

## 2016-04-27 MED ORDER — CEFTRIAXONE SODIUM-DEXTROSE 1-3.74 GM-% IV SOLR
INTRAVENOUS | Status: AC
  Filled 2016-04-27: qty 50

## 2016-04-27 NOTE — ED Notes (Signed)
Clear/white vaginal discharge with lower back pain, nausea, headache

## 2016-04-27 NOTE — ED Provider Notes (Addendum)
[facility] Emergency Department Provider Note   ____________________________________________    I have reviewed the triage vital signs and the nursing notes.   HISTORY  Chief Complaint Back Pain and Vaginal Discharge     HPI Christy Schmidt is a 21 y.o. female who presents with multiple complaints. She reports approximately 2-3 days ago she developed symptoms of nausea and vomiting. Simultaneously she developed vaginal discharge but denies abdominal or pelvic pain. She denies unprotected sexual intercourse. Over the last 2 days she has developed back pain bilaterally as well as chills. She denies dysuria. No urinary frequency. She denies cough. No shortness of breath.   Past Medical History:  Diagnosis Date  . ZOXWRUEA(540.9)     Patient Active Problem List   Diagnosis Date Noted  . Migraine without aura and without status migrainosus, not intractable 05/22/2012    Past Surgical History:  Procedure Laterality Date  . OTHER SURGICAL HISTORY Right 2010   Bone Removed on Right Hand 3rd Digit    Prior to Admission medications   Medication Sig Start Date End Date Taking? Authorizing Provider  ondansetron (ZOFRAN) 4 MG tablet Take 1 tablet (4 mg total) by mouth every 8 (eight) hours as needed for nausea or vomiting. Patient not taking: Reported on 04/27/2016 11/19/15   Jeanmarie Plant, MD  oxyCODONE-acetaminophen (ROXICET) 5-325 MG tablet Take 1 tablet by mouth every 6 (six) hours as needed. Patient not taking: Reported on 04/27/2016 11/19/15   Jeanmarie Plant, MD  tamsulosin (FLOMAX) 0.4 MG CAPS capsule Take 1 capsule (0.4 mg total) by mouth daily. Patient not taking: Reported on 04/27/2016 11/19/15   Jeanmarie Plant, MD     Allergies Patient has no known allergies.  Family History  Problem Relation Age of Onset  . Migraines Mother   . Depression Mother   . Migraines Maternal Grandmother   . Migraines Cousin     Social History Social  History  Substance Use Topics  . Smoking status: Current Every Day Smoker  . Smokeless tobacco: Never Used  . Alcohol use No    Review of Systems  Constitutional: positive chills Eyes: No visual changes.  ENT: No sore throat. Cardiovascular: Denies chest pain. Respiratory: Denies shortness of breath. Gastrointestinal: No abdominal pain.   Genitourinary: Negative for dysuria.positive vaginal discharge Musculoskeletal: positive back pain Skin: Negative for rash. Neurological: Negative for headaches, no focal weakness  10-point ROS otherwise negative.  ____________________________________________   PHYSICAL EXAM:  VITAL SIGNS: ED Triage Vitals  Enc Vitals Group     BP 04/27/16 1248 106/69     Pulse Rate 04/27/16 1248 (!) 135     Resp --      Temp 04/27/16 1248 99.1 F (37.3 C)     Temp Source 04/27/16 1248 Oral     SpO2 04/27/16 1248 95 %     Weight 04/27/16 1242 150 lb (68 kg)     Height --      Head Circumference --      Peak Flow --      Pain Score 04/27/16 1241 7     Pain Loc --      Pain Edu? --      Excl. in GC? --     Constitutional: Alert and oriented. No acute distress. Pleasant and interactive Eyes: Conjunctivae are normal.   Nose: No congestion/rhinnorhea. Mouth/Throat: Mucous membranes are moist.   Neck:  Painless ROM Cardiovascular: significant tachycardia noted, regular rhythm. Grossly normal heart  sounds.  Good peripheral circulation. Respiratory: Normal respiratory effort.  No retractions. Lungs CTAB. Gastrointestinal: Soft and nontender. No distention.  No CVA tenderness.benign abdominal exam Genitourinary: no cmt, thick whitish discharge, foul smelling, mild cervicitis Musculoskeletal:  Warm and well perfused Neurologic:  Normal speech and language. No gross focal neurologic deficits are appreciated.  Skin:  Skin is warm, dry and intact. No rash noted. Psychiatric: Mood and affect are normal. Speech and behavior are  normal.  ____________________________________________   LABS (all labs ordered are listed, but only abnormal results are displayed)  Labs Reviewed  WET PREP, GENITAL - Abnormal; Notable for the following:       Result Value   WBC, Wet Prep HPF POC TOO NUMEROUS TO COUNT (*)    All other components within normal limits  COMPREHENSIVE METABOLIC PANEL - Abnormal; Notable for the following:    Potassium 3.0 (*)    Glucose, Bld 109 (*)    BUN 5 (*)    All other components within normal limits  CBC WITH DIFFERENTIAL/PLATELET - Abnormal; Notable for the following:    Lymphs Abs 0.8 (*)    All other components within normal limits  URINALYSIS, COMPLETE (UACMP) WITH MICROSCOPIC - Abnormal; Notable for the following:    Color, Urine YELLOW (*)    APPearance HAZY (*)    Ketones, ur 20 (*)    Protein, ur 30 (*)    Leukocytes, UA TRACE (*)    Squamous Epithelial / LPF 6-30 (*)    All other components within normal limits  CHLAMYDIA/NGC RT PCR ([facility] ONLY)  URINE CULTURE  INFLUENZA PANEL BY PCR (TYPE A & B)  POCT PREGNANCY, URINE   ____________________________________________  EKG  None ____________________________________________  RADIOLOGY  [place] pending ____________________________________________   PROCEDURES  Procedure(s) performed: No    Critical Care performed: No ____________________________________________   INITIAL IMPRESSION / ASSESSMENT AND PLAN / ED COURSE  Pertinent labs & imaging results that were available during my care of the patient were reviewed by me and considered in my medical decision making (see chart for details).  Patient presents with unusual constellation of symptoms. No significantPelvic or abdominal pain.mild elevation temp after here, significant tachycardia. I do wonder if back pain is related to myalgias and if this could be influenza. Influenza PCR sent. Urine unremarkable. Significant white cells on wet prep. Lab work  unremarkable. we will give IV fluids and reevaluate.  ----------------------------------------- 3:29 PM on 04/27/2016 ----------------------------------------- Signed out to Dr. Pershing Proud, he will follow up on [place] results.      ____________________________________________   FINAL CLINICAL IMPRESSION(S) / ED DIAGNOSES  Final diagnoses:  Pelvic pain  PID (acute pelvic inflammatory disease)  Influenza A    NEW MEDICATIONS STARTED DURING THIS VISIT:  New Prescriptions   No medications on file     Note:  This document was prepared using Dragon voice recognition software and may include unintentional dictation errors.    Jene Every, MD 04/27/16 1529    Jene Every, MD 04/27/16 [phone]

## 2016-04-27 NOTE — ED Notes (Signed)
Pt reports n/v/d, starting 4/3.  Reports +PO intake

## 2016-04-27 NOTE — ED Triage Notes (Signed)
Pt to ed with c/o back pain and vaginal discharge that started about 3 days ago.

## 2016-04-27 NOTE — ED Provider Notes (Signed)
Signout from Dr. Cyril Loosen in this 21 year old female with a history of 3 days of body aches as well as lower abdominal pain and vaginal discharge. Pending ultrasound of the pelvis. Wound treatment for PID.  Physical Exam  BP 132/77   Pulse 92   Temp 99.1 F (37.3 C) (Oral)   Wt 150 lb (68 kg)   LMP 04/06/2016   SpO2 96%   BMI 24.21 kg/m  ----------------------------------------- 5:41 PM on 04/27/2016 ----------------------------------------- Patient without any distress at this time. Appears to be resting comfortably. Examination abdomen and it is soft and nontender.  Physical Exam  ED Course  Procedures  MDM US Transvaginal Non-OB (Final result)  Result time 04/27/16 16:38:58  Final result by Holley Dexter, MD (04/27/16 16:38:58)           Narrative:   CLINICAL DATA: Left pelvic pain for 1 day.  EXAM: TRANSABDOMINAL AND TRANSVAGINAL ULTRASOUND OF PELVIS  TECHNIQUE: Both transabdominal and transvaginal ultrasound examinations of the pelvis were performed. Transabdominal technique was performed for global imaging of the pelvis including uterus, ovaries, adnexal regions, and pelvic cul-de-sac. It was necessary to proceed with endovaginal exam following the transabdominal exam to visualize the ovaries.  COMPARISON: None  FINDINGS: Uterus  Measurements: 7.9 x 3.8 x 4.9 cm. No fibroids or other mass visualized.  Endometrium  Thickness: 0.8 cm. No focal abnormality visualized.  Right ovary  Measurements: 4.2 x 1.5 x 3.8 cm. Normal appearance/no adnexal mass.  Left ovary  Measurements: 3.8 x 2.9 x 3.8 cm. Normal appearance/no adnexal mass.  Other findings  No abnormal free fluid.  IMPRESSION: Normal examination.   Electronically Signed By: Drusilla Kanner M.D. On: 04/27/2016 16:38         Patient is out of the window for Tamiflu. Negative gonorrhea and chlamydia but clinically with PID. She knows not to have any sexual interaction until  she is further tests and cleared by her primary care doctor. She is seen at [facility]. She also knows to tell her partner to abstain from any sexual contact and to follow up with a provider for further testing.       Myrna Blazer, MD 04/27/16 [phone]

## 2016-04-29 LAB — URINE CULTURE

## 2016-05-03 ENCOUNTER — Emergency Department
Admission: EM | Admit: 2016-05-03 | Discharge: 2016-05-03 | Disposition: A | Discharge status: Home / Self Care | Payer: Medicaid Other | Attending: Emergency Medicine | Admitting: Emergency Medicine

## 2016-05-03 ENCOUNTER — Encounter: Payer: Self-pay | Admitting: *Deleted

## 2016-05-03 DIAGNOSIS — B349 Viral infection, unspecified: Secondary | ICD-10-CM | POA: Insufficient documentation

## 2016-05-03 DIAGNOSIS — F17200 Nicotine dependence, unspecified, uncomplicated: Secondary | ICD-10-CM | POA: Insufficient documentation

## 2016-05-03 DIAGNOSIS — R69 Illness, unspecified: Secondary | ICD-10-CM

## 2016-05-03 DIAGNOSIS — J111 Influenza due to unidentified influenza virus with other respiratory manifestations: Secondary | ICD-10-CM

## 2016-05-03 MED ORDER — DIPHENOXYLATE-ATROPINE 2.5-0.025 MG PO TABS
2.0000 | ORAL_TABLET | Freq: Once | ORAL | Status: AC
  Administered 2016-05-03: 2 via ORAL
  Filled 2016-05-03: qty 2

## 2016-05-03 MED ORDER — KETOROLAC TROMETHAMINE 60 MG/2ML IM SOLN
60.0000 mg | Freq: Once | INTRAMUSCULAR | Status: AC
  Administered 2016-05-03: 60 mg via INTRAMUSCULAR
  Filled 2016-05-03: qty 2

## 2016-05-03 MED ORDER — ONDANSETRON 8 MG PO TBDP
8.0000 mg | ORAL_TABLET | Freq: Once | ORAL | Status: AC
  Administered 2016-05-03: 8 mg via ORAL
  Filled 2016-05-03: qty 1

## 2016-05-03 MED ORDER — IBUPROFEN 600 MG PO TABS: 600.0000 mg | ORAL_TABLET | Freq: Three times a day (TID) | ORAL | 0 refills | Status: DC | PRN

## 2016-05-03 MED ORDER — PSEUDOEPH-BROMPHEN-DM 30-2-10 MG/5ML PO SYRP: 5.0000 mL | ORAL_SOLUTION | Freq: Four times a day (QID) | ORAL | 0 refills | Status: DC | PRN

## 2016-05-03 MED ORDER — ONDANSETRON HCL 8 MG PO TABS: 8.0000 mg | ORAL_TABLET | Freq: Three times a day (TID) | ORAL | 0 refills | Status: DC | PRN

## 2016-05-03 NOTE — ED Triage Notes (Signed)
Pt was seen in ED 1 week ago for the flu, pt reports feeling achy, diarrhea and not getting any better, pt reports fevers at home with the highest fever of 101 on Tuesday, pt able to drink PO fluids

## 2016-05-03 NOTE — ED Provider Notes (Signed)
[facility] Emergency Department Provider Note   ____________________________________________   None    (approximate)  I have reviewed the triage vital signs and the nursing notes.   HISTORY  Chief Complaint Influenza    HPI Christy Schmidt is a 21 y.o. female patient continue to have flulike symptoms status post 1 week of being diagnosed with influenza. Patient was out at a 3 day window for treatment.Patient states she continues to have body aches, nausea, vomiting, and diarrhea. Patient state last night she had a temperature 101. Patient able to tolerate fluids but has decreased appetite. Patient describes the pain as "achy".   Past Medical History:  Diagnosis Date  . [id number](478.2)     Patient Active Problem List   Diagnosis Date Noted  . Migraine without aura and without status migrainosus, not intractable 05/22/2012    Past Surgical History:  Procedure Laterality Date  . OTHER SURGICAL HISTORY Right 2010   Bone Removed on Right Hand 3rd Digit    Prior to Admission medications   Medication Sig Start Date End Date Taking? Authorizing Provider  brompheniramine-pseudoephedrine-DM 30-2-10 MG/5ML syrup Take 5 mLs by mouth 4 (four) times daily as needed. 05/03/16   Joni Reining, PA-C  doxycycline (VIBRAMYCIN) 100 MG capsule Take 1 capsule (100 mg total) by mouth 2 (two) times daily. 04/27/16   Myrna Blazer, MD  ibuprofen (ADVIL,MOTRIN) 600 MG tablet Take 1 tablet (600 mg total) by mouth every 8 (eight) hours as needed. 05/03/16   Joni Reining, PA-C  ondansetron (ZOFRAN) 4 MG tablet Take 1 tablet (4 mg total) by mouth every 8 (eight) hours as needed for nausea or vomiting. Patient not taking: Reported on 04/27/2016 11/19/15   Jeanmarie Plant, MD  ondansetron (ZOFRAN) 8 MG tablet Take 1 tablet (8 mg total) by mouth every 8 (eight) hours as needed for nausea or vomiting. 05/03/16   Joni Reining, PA-C  oxyCODONE-acetaminophen (ROXICET)  5-325 MG tablet Take 1 tablet by mouth every 6 (six) hours as needed. Patient not taking: Reported on 04/27/2016 11/19/15   Jeanmarie Plant, MD  tamsulosin (FLOMAX) 0.4 MG CAPS capsule Take 1 capsule (0.4 mg total) by mouth daily. Patient not taking: Reported on 04/27/2016 11/19/15   Jeanmarie Plant, MD    Allergies Patient has no known allergies.  Family History  Problem Relation Age of Onset  . Migraines Mother   . Depression Mother   . Migraines Maternal Grandmother   . Migraines Cousin     Social History Social History  Substance Use Topics  . Smoking status: Current Every Day Smoker  . Smokeless tobacco: Never Used  . Alcohol use No    Review of Systems Constitutional: No fever/chills. Body aches Eyes: No visual changes. ENT: No sore throat. Cardiovascular: Denies chest pain. Respiratory: Denies shortness of breath. Gastrointestinal: No abdominal pain. Nausea, vomiting, and diarrhea.  No constipation. Genitourinary: Negative for dysuria. Musculoskeletal: Negative for back pain. Skin: Negative for rash. Neurological: Negative for headaches, focal weakness or numbness.    ____________________________________________   PHYSICAL EXAM:  VITAL SIGNS: ED Triage Vitals  Enc Vitals Group     BP 05/03/16 0813 114/64     Pulse Rate 05/03/16 0813 75     Resp 05/03/16 0813 18     Temp 05/03/16 0813 97.9 F (36.6 C)     Temp Source 05/03/16 0813 Oral     SpO2 05/03/16 0813 100 %     Weight 05/03/16  0809 150 lb (68 kg)     Height --      Head Circumference --      Peak Flow --      Pain Score --      Pain Loc --      Pain Edu? --      Excl. in GC? --     Constitutional: Alert and oriented. Well appearing and in no acute distress. Eyes: Conjunctivae are normal. PERRL. EOMI. Head: Atraumatic. Nose: Edematous nasal terms clear rhinorrhea Mouth/Throat: Mucous membranes are moist.  Oropharynx non-erythematous.Postnasal drainage Neck: No stridor.  No cervical spine  tenderness to palpation. Hematological/Lymphatic/Immunilogical: No cervical lymphadenopathy. Cardiovascular: Normal rate, regular rhythm. Grossly normal heart sounds.  Good peripheral circulation. Respiratory: Normal respiratory effort.  No retractions. Lungs CTAB. Gastrointestinal: Soft and nontender. No distention. Hyperactive bowel sounds. No abdominal bruits. No CVA tenderness. Musculoskeletal: No lower extremity tenderness nor edema.  No joint effusions. Neurologic:  Normal speech and language. No gross focal neurologic deficits are appreciated. No gait instability. Skin:  Skin is warm, dry and intact. No rash noted. Psychiatric: Mood and affect are normal. Speech and behavior are normal.  ____________________________________________   LABS (all labs ordered are listed, but only abnormal results are displayed)  Labs Reviewed - No data to display ____________________________________________  EKG   ____________________________________________  RADIOLOGY   ____________________________________________   PROCEDURES  Procedure(s) performed: None  Procedures  Critical Care performed: No  ____________________________________________   INITIAL IMPRESSION / ASSESSMENT AND PLAN / ED COURSE  Pertinent labs & imaging results that were available during my care of the patient were reviewed by me and considered in my medical decision making (see chart for details).  Viral illness.      ____________________________________________   FINAL CLINICAL IMPRESSION(S) / ED DIAGNOSES  Final diagnoses:  Influenza-like illness  Patient given discharge care instructions. Patient is given Toradol, Zofran, and Lomotil prior to departure. Patient given a work note.    NEW MEDICATIONS STARTED DURING THIS VISIT:  New Prescriptions   BROMPHENIRAMINE-PSEUDOEPHEDRINE-DM 30-2-10 MG/5ML SYRUP    Take 5 mLs by mouth 4 (four) times daily as needed.   IBUPROFEN (ADVIL,MOTRIN) 600 MG  TABLET    Take 1 tablet (600 mg total) by mouth every 8 (eight) hours as needed.   ONDANSETRON (ZOFRAN) 8 MG TABLET    Take 1 tablet (8 mg total) by mouth every 8 (eight) hours as needed for nausea or vomiting.     Note:  This document was prepared using Dragon voice recognition software and may include unintentional dictation errors.    Joni Reining, PA-C 05/03/16 1610    Emily Filbert, MD 05/03/16 1009

## 2016-05-03 NOTE — ED Notes (Signed)
See triage note   States she was dx'd with the flu last week  States she  started to feel better  Then Monday night she developed fever /chills   And then has had approx 4-5 episodes of vomiting daily  Last time vomited was this am PTA   Afebrile on arrival   Feels weak

## 2016-06-11 ENCOUNTER — Emergency Department
Admission: EM | Admit: 2016-06-11 | Discharge: 2016-06-11 | Disposition: A | Discharge status: Home / Self Care | Payer: Medicaid Other | Attending: Emergency Medicine | Admitting: Emergency Medicine

## 2016-06-11 DIAGNOSIS — O209 Hemorrhage in early pregnancy, unspecified: Secondary | ICD-10-CM | POA: Insufficient documentation

## 2016-06-11 DIAGNOSIS — O99330 Smoking (tobacco) complicating pregnancy, unspecified trimester: Secondary | ICD-10-CM | POA: Insufficient documentation

## 2016-06-11 DIAGNOSIS — Z3A00 Weeks of gestation of pregnancy not specified: Secondary | ICD-10-CM | POA: Insufficient documentation

## 2016-06-11 DIAGNOSIS — F17200 Nicotine dependence, unspecified, uncomplicated: Secondary | ICD-10-CM | POA: Insufficient documentation

## 2016-06-11 DIAGNOSIS — Z5321 Procedure and treatment not carried out due to patient leaving prior to being seen by health care provider: Secondary | ICD-10-CM | POA: Insufficient documentation

## 2016-06-11 LAB — HCG, QUANTITATIVE, PREGNANCY: hCG, Beta Chain, Quant, S: 379 m[IU]/mL — ABNORMAL HIGH (ref <5)

## 2016-06-11 LAB — POCT PREGNANCY, URINE: PREG TEST UR: POSITIVE — AB

## 2016-06-11 NOTE — ED Notes (Signed)
Called x1 no answer, first nurse notified.

## 2016-06-11 NOTE — ED Triage Notes (Signed)
Pt presents to ED c/o vaginal discharge and vaginal spotting. Pt stated she was pregnant ; found out recently.Denies abdominal pain Pt concerned of miscarriage

## 2017-01-06 IMAGING — CT CT RENAL STONE PROTOCOL
2 of 4 series · 16 of 46 positions shown, 18 images · non-contrast
Comparison: None.

CLINICAL DATA: Right lower quadrant/right flank pain since this
morning radiating to the right groin. Gross hematuria.

EXAM:
CT ABDOMEN AND PELVIS WITHOUT CONTRAST
TECHNIQUE: Multidetector CT imaging of the abdomen and pelvis was performed
following the standard protocol without IV contrast.

[Series 2: axial st · axial · 0.65mm/px · z∈[-826,-446]mm · 13 of 84 slices shown, 15 images]
[im 4/84  soft-tissue]
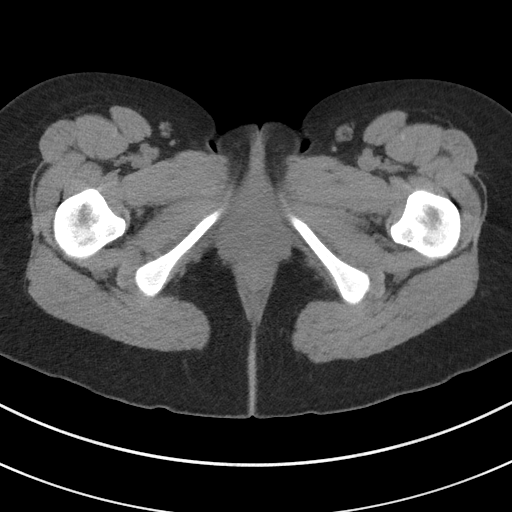
[im 4/84  bone]
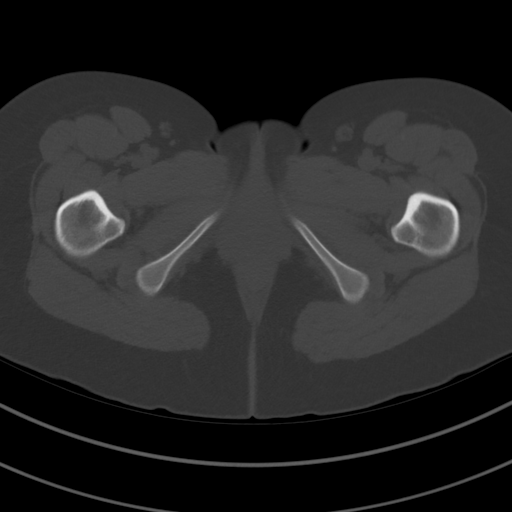
[im 11/84  soft-tissue]
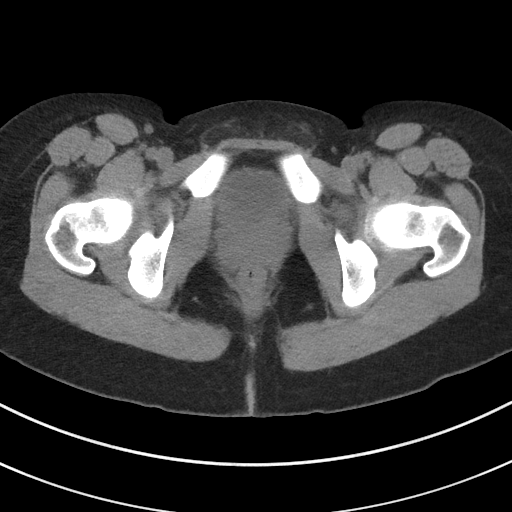
[im 18/84  soft-tissue]
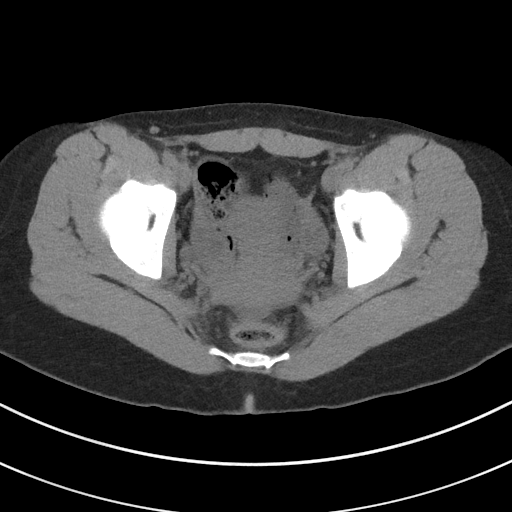
[im 25/84  soft-tissue]
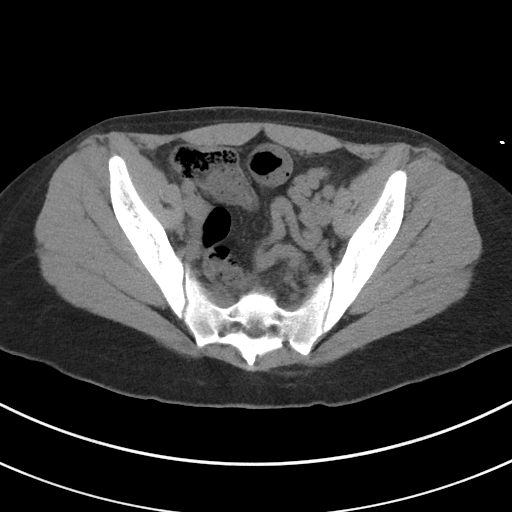
[im 28/84  soft-tissue]
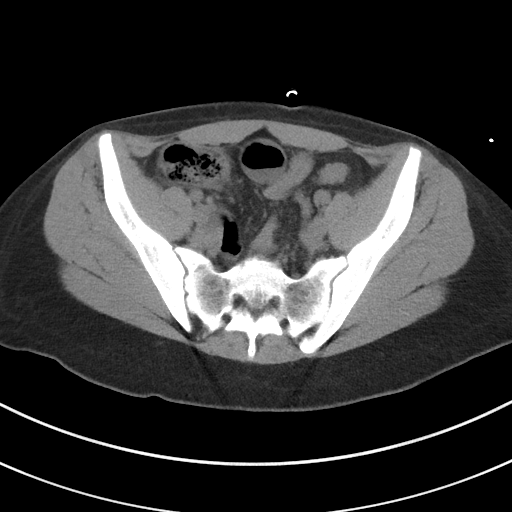
[im 35/84  soft-tissue]
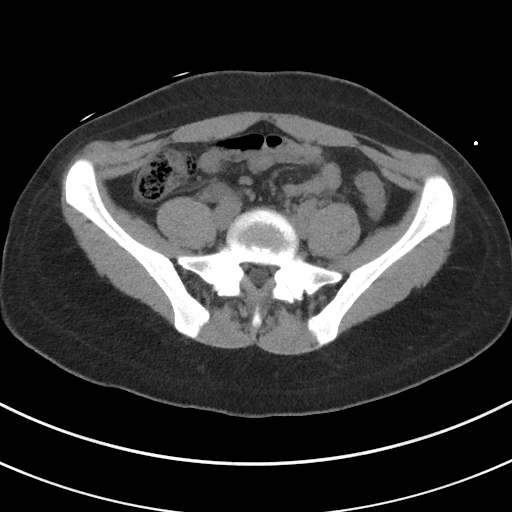
[im 42/84  soft-tissue]
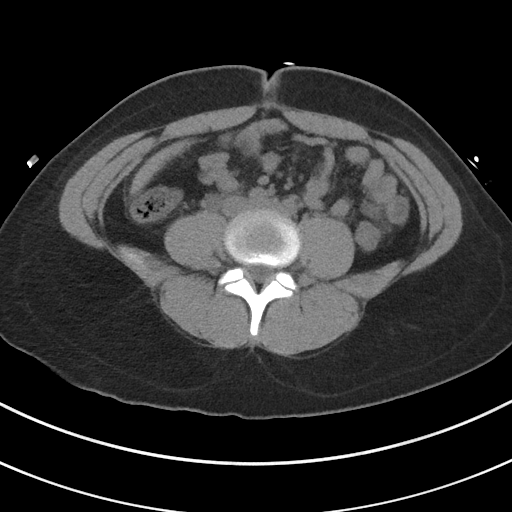
[im 49/84  soft-tissue]
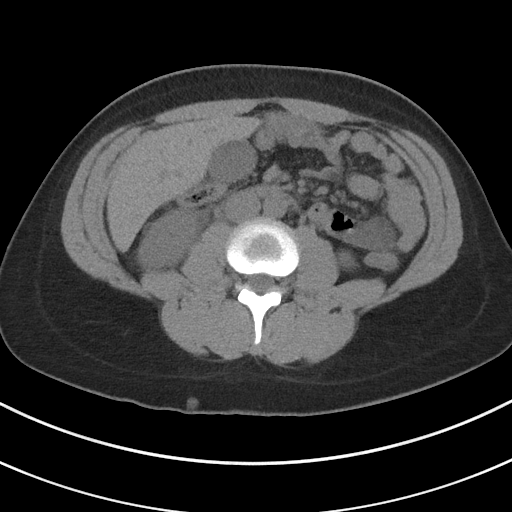
[im 56/84  soft-tissue]
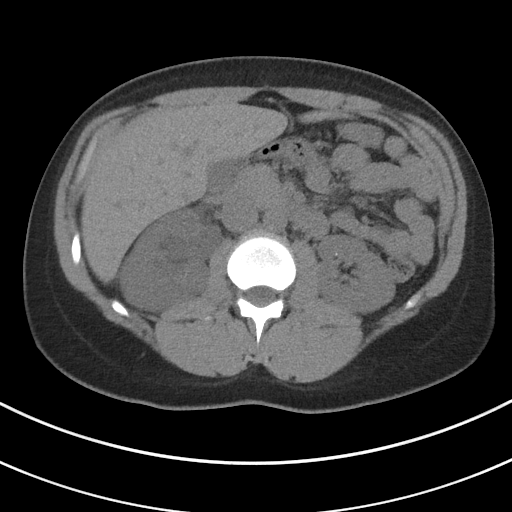
[im 56/84  bone]
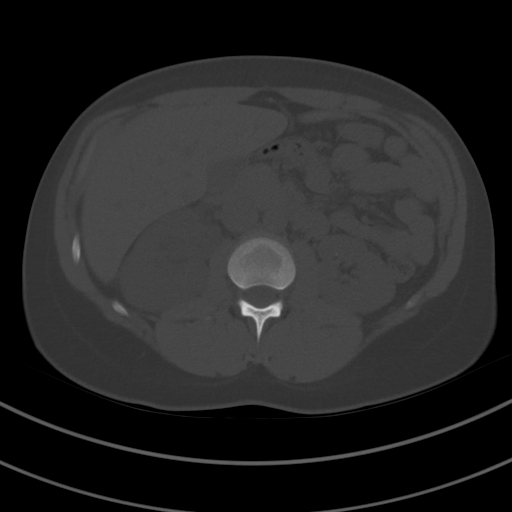
[im 59/84  soft-tissue]
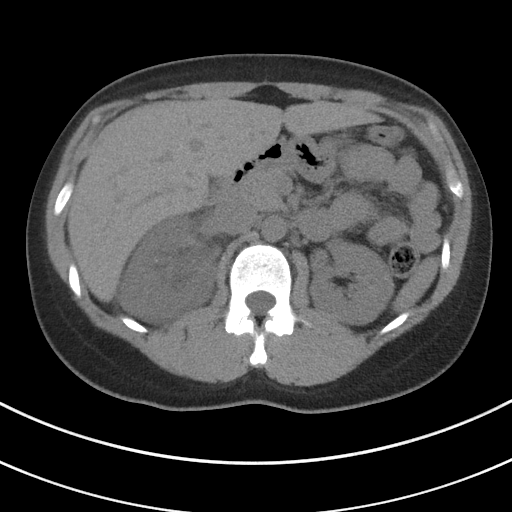
[im 66/84  soft-tissue]
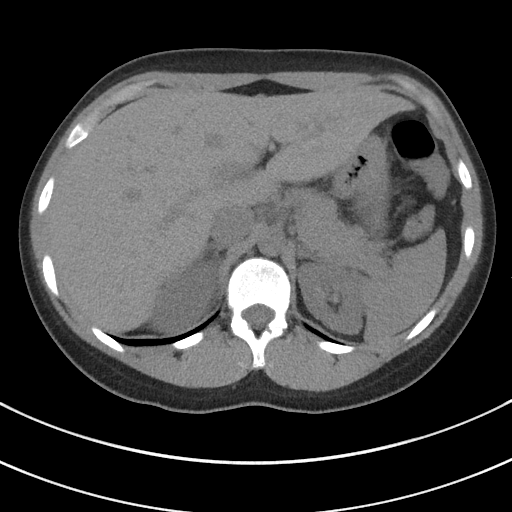
[im 73/84  soft-tissue]
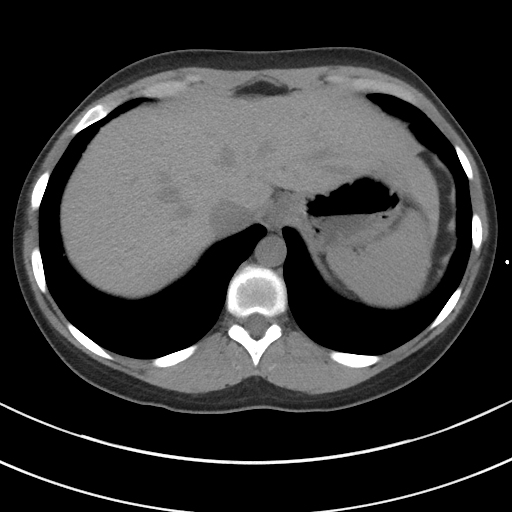
[im 80/84  soft-tissue]
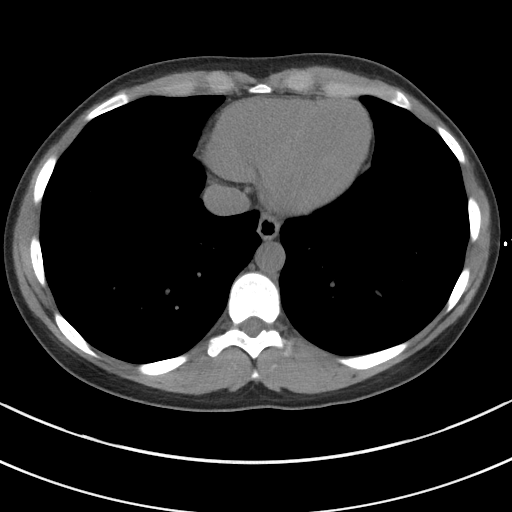

[Series 5: coronal · coronal · 0.65mm/px · 3 of 121 slices shown]
[im 41/121  soft-tissue]
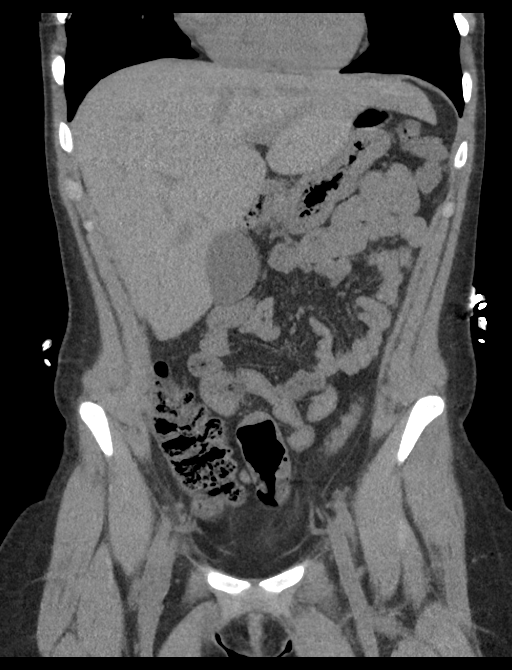
[im 54/121  soft-tissue]
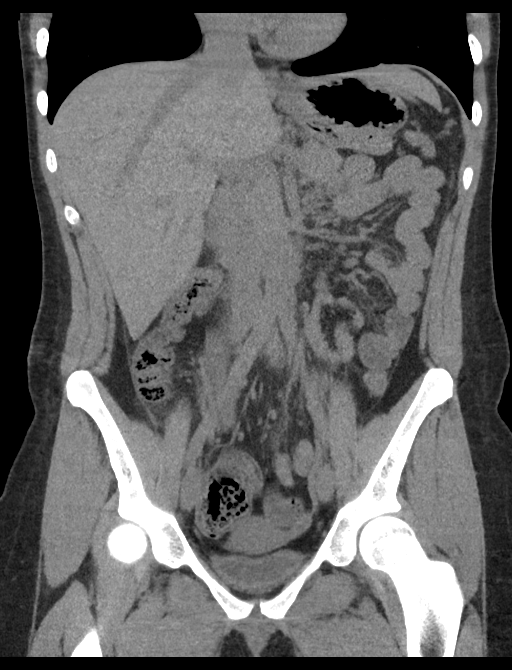
[im 67/121  soft-tissue]
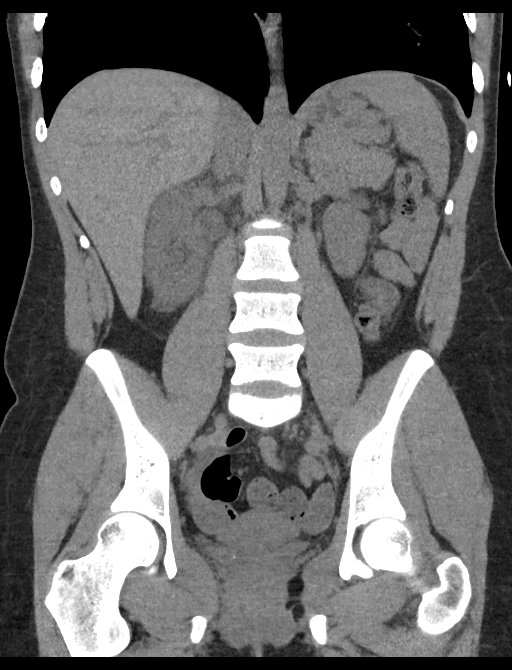

[16 of 46 positions shown; findings below may reference images not displayed]

FINDINGS: Lower chest: No significant pulmonary nodules or acute consolidative
airspace disease.

Hepatobiliary: Normal liver with no liver mass. Normal gallbladder
with no radiopaque cholelithiasis. No biliary ductal dilatation.

Pancreas: Normal, with no mass or duct dilation.

Spleen: Normal size. No mass.

Adrenals/Urinary Tract: Normal adrenals. Obstructing 3 mm right
ureterovesical junction stone with mild to moderate right
hydroureteronephrosis. Asymmetric right perinephric fat stranding
and ill-defined fluid. Two additional punctate nonobstructing 1-2 mm
interpolar right renal stones. Nonobstructing 2 mm lower left renal
stone. No left hydronephrosis. No contour deforming renal mass.
Normal caliber left ureter, with no left ureteral stones. Collapsed
and grossly normal bladder with no bladder stones or appreciable
bladder wall thickening.

Stomach/Bowel: Grossly normal stomach. Normal caliber small bowel
with no small bowel wall thickening. Normal appendix. Normal large
bowel with no diverticulosis, large bowel wall thickening or
pericolonic fat stranding.

Vascular/Lymphatic: Normal caliber abdominal aorta. No
pathologically enlarged lymph nodes in the abdomen or pelvis.

Reproductive: Grossly normal uterus.  No adnexal mass.

Other: No pneumoperitoneum, ascites or focal fluid collection.
Low-attenuation superficial subcutaneous 0.7 cm lesion in the medial
posterior right back (series 2/ image 36), nonspecific, favor a
small sebaceous cyst.

Musculoskeletal: No aggressive appearing focal osseous lesions.
IMPRESSION: 1. Obstructing 3 mm right UVJ stone with mild to moderate right
hydroureteronephrosis.
2. Additional punctate nonobstructing stones in both kidneys.

## 2017-05-10 IMAGING — US US TRANSVAGINAL NON-OB
1 series · 14 of 25 positions shown · non-contrast
Comparison: None

CLINICAL DATA: Left pelvic pain for 1 day.



[Series 1: us transvaginal non-ob · 0.24mm/px · 14 of 116 slices shown]
[im 1/116]
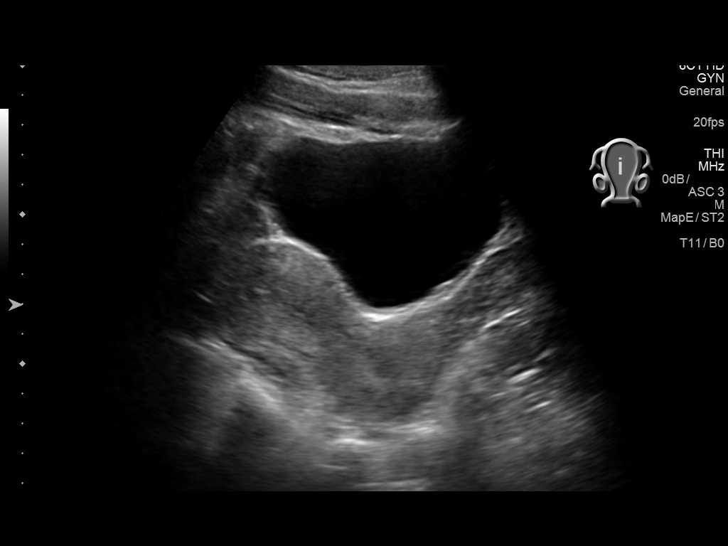
[im 10/116]
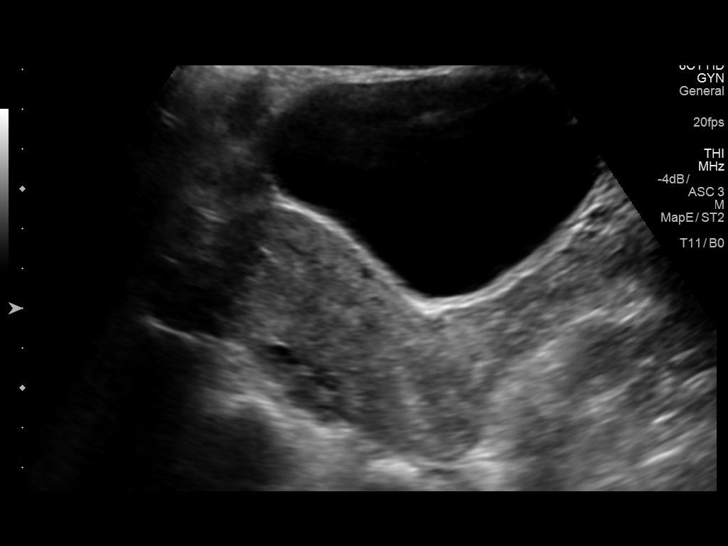
[im 20/116]
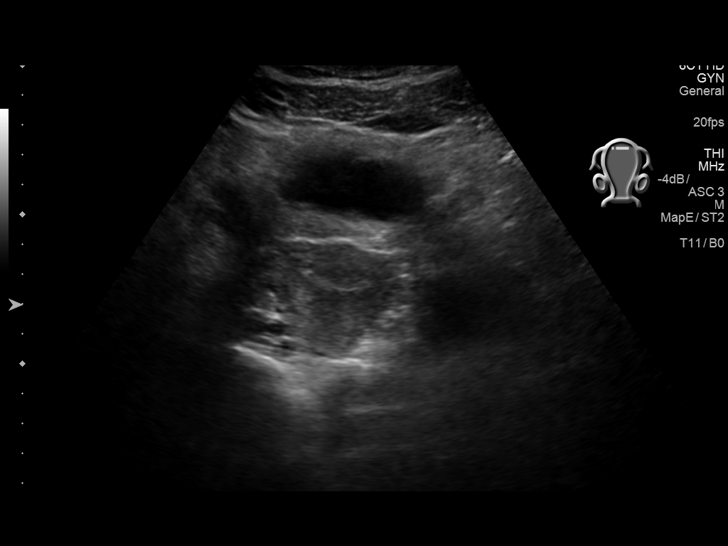
[im 29/116]
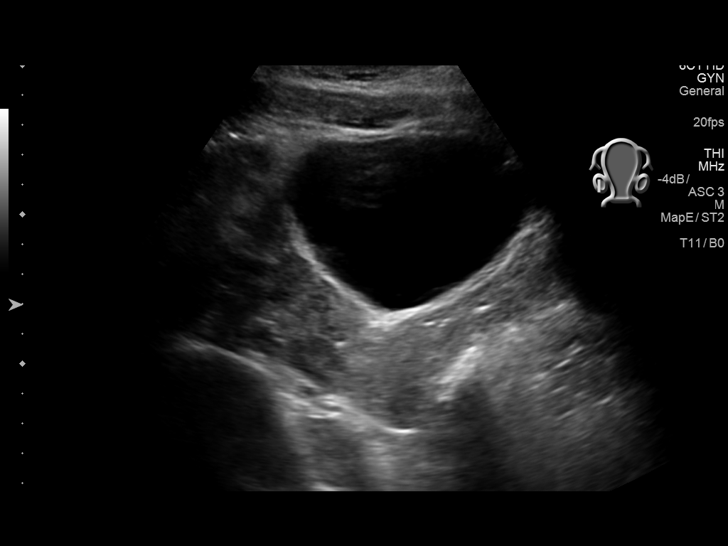
[im 39/116]
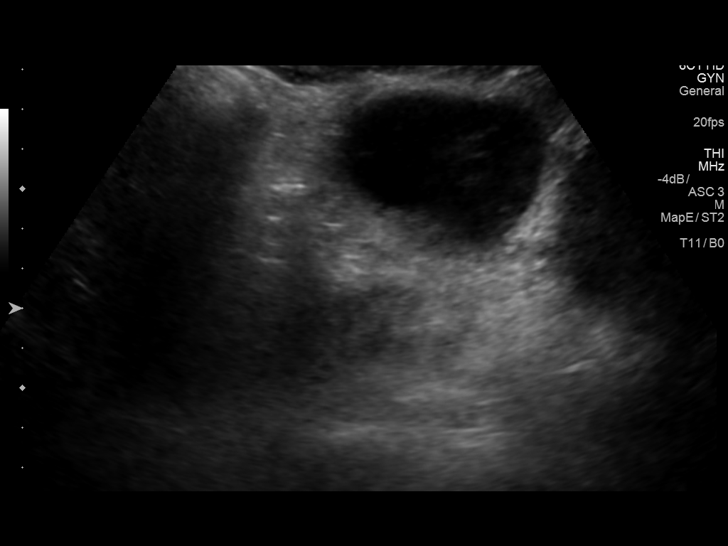
[im 44/116]
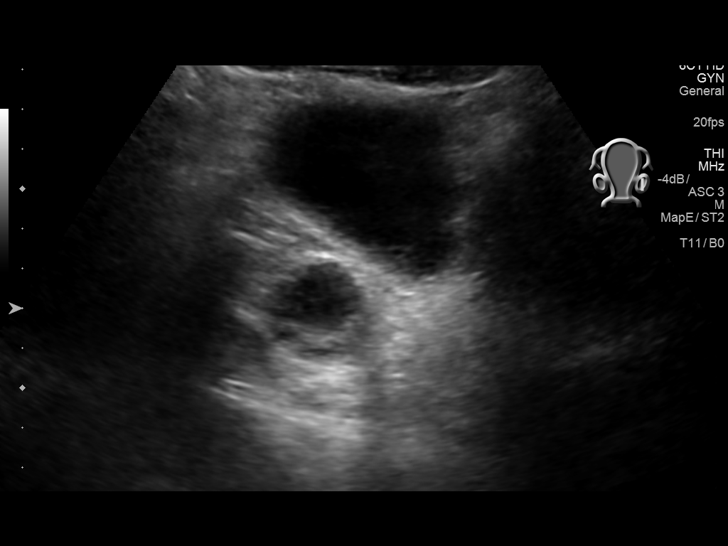
[im 53/116]
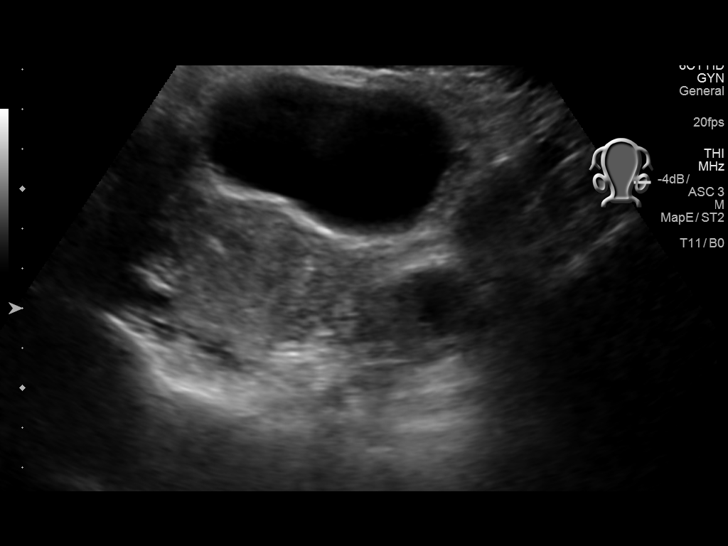
[im 63/116]
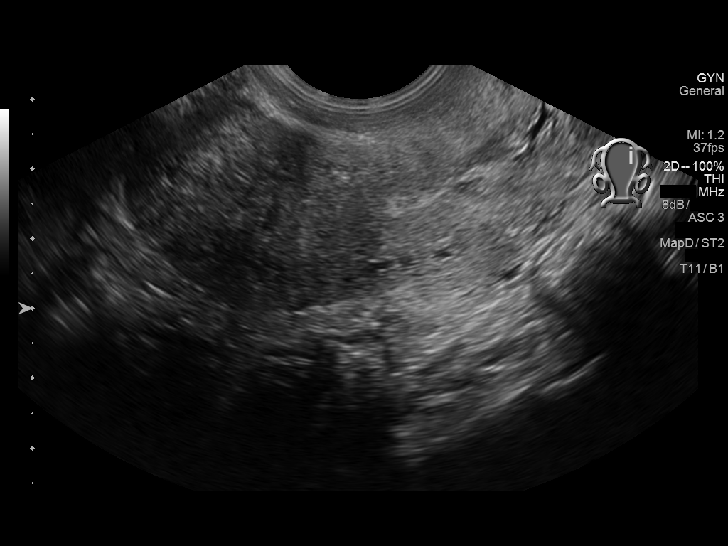
[im 72/116]
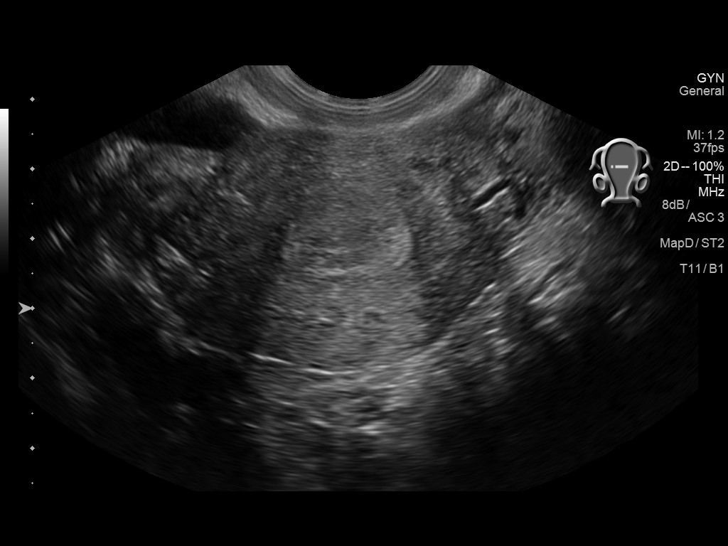
[im 77/116]
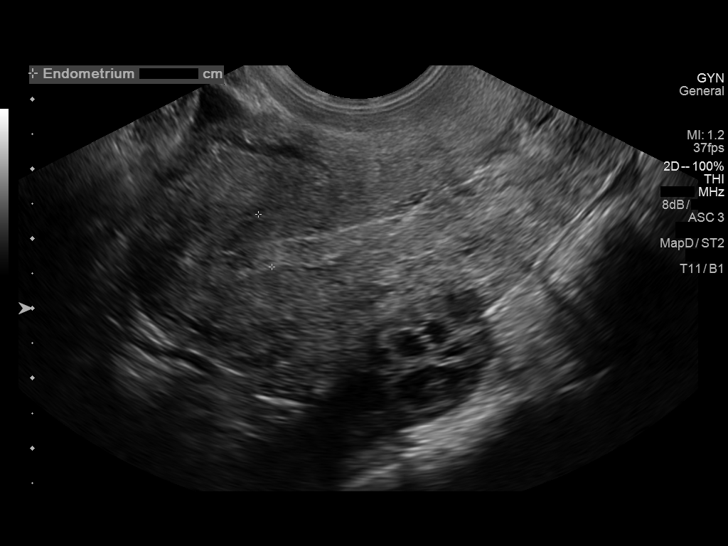
[im 87/116]
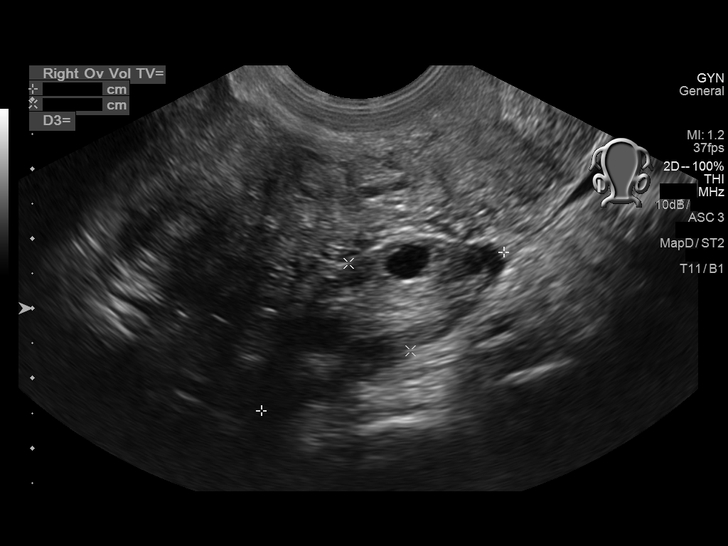
[im 96/116]
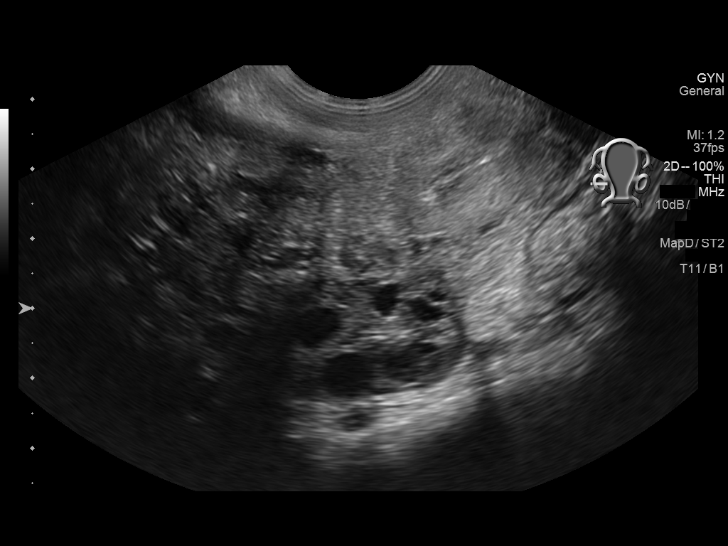
[im 106/116]
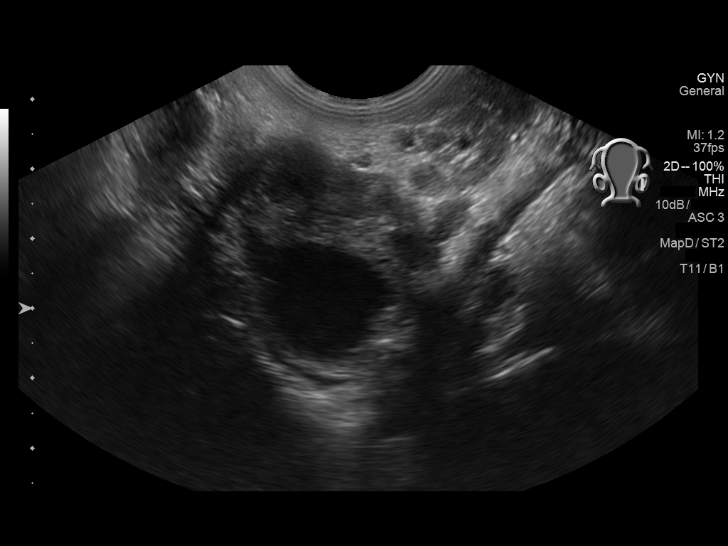
[im 116/116]
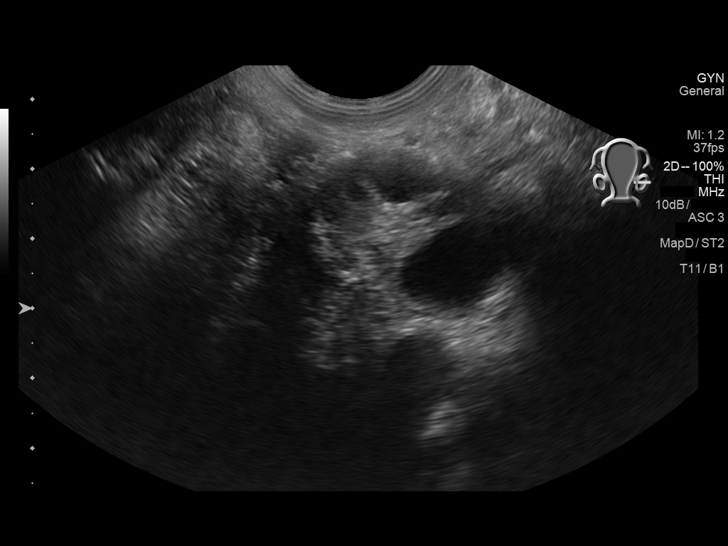

[14 of 25 positions shown; findings below may reference images not displayed]

FINDINGS: Uterus

Measurements: 7.9 x 3.8 x 4.9 cm. No fibroids or other mass
visualized.

Endometrium

Thickness: 0.8 cm.  No focal abnormality visualized.

Right ovary

Measurements: 4.2 x 1.5 x 3.8 cm. Normal appearance/no adnexal mass.

Left ovary

Measurements: 3.8 x 2.9 x 3.8 cm. Normal appearance/no adnexal mass.

Other findings

No abnormal free fluid.
IMPRESSION: Normal examination.

## 2017-10-30 ENCOUNTER — Encounter: Admitting: Obstetrics and Gynecology

## 2017-11-07 ENCOUNTER — Ambulatory Visit: Admitting: Obstetrics and Gynecology

## 2017-11-07 ENCOUNTER — Encounter: Payer: Self-pay | Admitting: Obstetrics and Gynecology

## 2017-11-07 ENCOUNTER — Other Ambulatory Visit (HOSPITAL_COMMUNITY)
Admission: RE | Admit: 2017-11-07 | Discharge: 2017-11-07 | Disposition: A | Discharge status: Home / Self Care | Payer: Medicaid Other | Source: Ambulatory Visit | Attending: Obstetrics and Gynecology | Admitting: Obstetrics and Gynecology

## 2017-11-07 VITALS — BP 113/65 | HR 92 | Ht 66.0 in | Wt 181.5 lb

## 2017-11-07 DIAGNOSIS — N760 Acute vaginitis: Secondary | ICD-10-CM | POA: Insufficient documentation

## 2017-11-07 DIAGNOSIS — B9689 Other specified bacterial agents as the cause of diseases classified elsewhere: Secondary | ICD-10-CM | POA: Insufficient documentation

## 2017-11-07 DIAGNOSIS — Z3401 Encounter for supervision of normal first pregnancy, first trimester: Secondary | ICD-10-CM

## 2017-11-07 DIAGNOSIS — L249 Irritant contact dermatitis, unspecified cause: Secondary | ICD-10-CM

## 2017-11-07 MED ORDER — SECNIDAZOLE 2 G PO PACK: 1.0000 | PACK | Freq: Once | ORAL | 1 refills | Status: AC

## 2017-11-07 MED ORDER — CLOBETASOL PROPIONATE 0.05 % EX OINT: 1.0000 "application " | TOPICAL_OINTMENT | Freq: Two times a day (BID) | CUTANEOUS | 2 refills | Status: DC

## 2017-11-07 NOTE — Addendum Note (Signed)
Addended by: Rosine Beat L on: 11/07/2017 04:11 PM   Modules accepted: Orders

## 2017-11-07 NOTE — Progress Notes (Signed)
  Subjective:     Patient ID: Christy Schmidt, female   DOB: January 15, 1996, 22 y.o.   MRN: [id number]  HPI Here with concerns about increased yellow vaginal discharge with odor for 3 days.  Denies vaginal itching or dysuria. Does also report a itchy rash on neck and chest for 2 days that is now dry patches. And had a migraine for 2 days that stopped yesterday. She is currently [redacted] weeks pregnant with her second pregnancy. She went to abortion clinic 2 weeks ago and when she found out how far along she was, she decided against abortion and plans to keep the infant.  Has appointment set up next week to start prenatal care.   Review of Systems  Constitutional: Positive for fatigue.  Genitourinary: Positive for vaginal discharge.  Skin: Positive for rash.  Neurological: Positive for headaches.  All other systems reviewed and are negative.      Objective:   Physical Exam A&Ox4 Well groomed female in no distress Blood pressure 113/65, pulse 92, height 5\' 6"  (1.676 m), weight 181 lb 8 oz (82.3 kg). Urinalysis    Component Value Date/Time   COLORURINE YELLOW (A) 04/27/2016 1251   APPEARANCEUR HAZY (A) 04/27/2016 1251   APPEARANCEUR Hazy 08/10/2013 1357   LABSPEC 1.019 04/27/2016 1251   LABSPEC 1.018 08/10/2013 1357   PHURINE 6.0 04/27/2016 1251   GLUCOSEU NEGATIVE 04/27/2016 1251   GLUCOSEU Negative 08/10/2013 1357   HGBUR NEGATIVE 04/27/2016 1251   BILIRUBINUR NEGATIVE 04/27/2016 1251   BILIRUBINUR neg 08/23/2015 1516   BILIRUBINUR Negative 08/10/2013 1357   KETONESUR 20 (A) 04/27/2016 1251   PROTEINUR 30 (A) 04/27/2016 1251   UROBILINOGEN 0.2 08/23/2015 1516   UROBILINOGEN 0.2 01/29/2014 1347   NITRITE NEGATIVE 04/27/2016 1251   LEUKOCYTESUR TRACE (A) 04/27/2016 1251   LEUKOCYTESUR 2+ 08/10/2013 1357  Skin - no sores or suspicious lesions or color changes except for fine flat rash on center front of neck and right upper chect-c/w dermatitis. Pelvic exam: normal external genitalia,  vulva, vagina, cervix, uterus and adnexa, CERVIX: normal appearing cervix without discharge or lesions, cervical discharge present - copious, frothy and green, WET MOUNT done - results: KOH done, clue cells, excessive bacteria, white blood cells. FHR 140's FH 16 weeks    Assessment:     BV Contact dermatitis Pregnancy     Plan:     Solosec prescription sent in and instructed on use. clobex ointment prescribed and to use two times a day until rash resolves.  RTC at planned new OB appointment in 1 week.   Melody Shambley,CNM

## 2017-11-11 LAB — GC/CHLAMYDIA PROBE AMP (~~LOC~~) NOT AT ARMC
CHLAMYDIA, DNA PROBE: NEGATIVE
NEISSERIA GONORRHEA: NEGATIVE

## 2017-11-12 ENCOUNTER — Encounter: Admitting: Obstetrics and Gynecology

## 2017-11-19 DIAGNOSIS — O0930 Supervision of pregnancy with insufficient antenatal care, unspecified trimester: Secondary | ICD-10-CM | POA: Diagnosis not present

## 2017-11-19 DIAGNOSIS — Z1388 Encounter for screening for disorder due to exposure to contaminants: Secondary | ICD-10-CM | POA: Diagnosis not present

## 2017-11-19 DIAGNOSIS — Z3009 Encounter for other general counseling and advice on contraception: Secondary | ICD-10-CM | POA: Diagnosis not present

## 2017-11-19 DIAGNOSIS — Z3482 Encounter for supervision of other normal pregnancy, second trimester: Secondary | ICD-10-CM | POA: Diagnosis not present

## 2017-11-19 DIAGNOSIS — Z113 Encounter for screening for infections with a predominantly sexual mode of transmission: Secondary | ICD-10-CM | POA: Diagnosis not present

## 2017-11-19 DIAGNOSIS — Z0389 Encounter for observation for other suspected diseases and conditions ruled out: Secondary | ICD-10-CM | POA: Diagnosis not present

## 2017-11-22 DIAGNOSIS — Z3689 Encounter for other specified antenatal screening: Secondary | ICD-10-CM | POA: Diagnosis not present

## 2017-12-25 ENCOUNTER — Encounter: Payer: Self-pay | Admitting: Certified Nurse Midwife

## 2018-01-08 ENCOUNTER — Encounter: Payer: Self-pay | Admitting: Certified Nurse Midwife

## 2018-01-08 ENCOUNTER — Ambulatory Visit (INDEPENDENT_AMBULATORY_CARE_PROVIDER_SITE_OTHER): Payer: Medicaid Other | Admitting: Certified Nurse Midwife

## 2018-01-08 VITALS — BP 106/63 | HR 80 | Ht 66.0 in | Wt 199.1 lb

## 2018-01-08 DIAGNOSIS — Z8759 Personal history of other complications of pregnancy, childbirth and the puerperium: Secondary | ICD-10-CM | POA: Insufficient documentation

## 2018-01-08 DIAGNOSIS — O09292 Supervision of pregnancy with other poor reproductive or obstetric history, second trimester: Secondary | ICD-10-CM | POA: Diagnosis not present

## 2018-01-08 DIAGNOSIS — Z862 Personal history of diseases of the blood and blood-forming organs and certain disorders involving the immune mechanism: Secondary | ICD-10-CM

## 2018-01-08 DIAGNOSIS — O09299 Supervision of pregnancy with other poor reproductive or obstetric history, unspecified trimester: Secondary | ICD-10-CM

## 2018-01-08 DIAGNOSIS — Z3A25 25 weeks gestation of pregnancy: Secondary | ICD-10-CM | POA: Diagnosis not present

## 2018-01-08 DIAGNOSIS — Z9289 Personal history of other medical treatment: Secondary | ICD-10-CM | POA: Insufficient documentation

## 2018-01-08 LAB — POCT URINALYSIS DIPSTICK OB
Bilirubin, UA: NEGATIVE
Blood, UA: NEGATIVE
GLUCOSE, UA: NEGATIVE
Ketones, UA: NEGATIVE
LEUKOCYTES UA: NEGATIVE
NITRITE UA: NEGATIVE
PROTEIN: NEGATIVE
SPEC GRAV UA: 1.02 (ref 1.010–1.025)
Urobilinogen, UA: 0.2 E.U./dL
pH, UA: 6 (ref 5.0–8.0)

## 2018-01-08 NOTE — Addendum Note (Signed)
Addended by: Brooke DareSICK, Raymund Manrique L on: 01/08/2018 09:05 AM   Modules accepted: Orders

## 2018-01-08 NOTE — Patient Instructions (Signed)
Prenatal Care Prenatal care is health care during pregnancy. It helps you and your unborn baby (fetus) stay as healthy as possible. Prenatal care may be provided by a midwife, a family practice health care provider, or a childbirth and pregnancy specialist (obstetrician). How does this affect me? During pregnancy, you will be closely monitored for any new conditions that might develop. To lower your risk of pregnancy complications, you and your health care provider will talk about any underlying conditions you have. How does this affect my baby? Early and consistent prenatal care increases the chance that your baby will be healthy during pregnancy. Prenatal care lowers the risk that your baby will be:  Born early (prematurely).  Smaller than expected at birth (small for gestational age). What can I expect at the first prenatal care visit? Your first prenatal care visit will likely be the longest. You should schedule your first prenatal care visit as soon as you know that you are pregnant. Your first visit is a good time to talk about any questions or concerns you have about pregnancy. At your visit, you and your health care provider will talk about:  Your medical history, including: ? Any past pregnancies. ? Your family's medical history. ? The baby's father's medical history. ? Any long-term (chronic) health conditions you have and how you manage them. ? Any surgeries or procedures you have had. ? Any current over-the-counter or prescription medicines, herbs, or supplements you are taking.  Other factors that could pose a risk to your baby, including:  Your home setting and your stress levels, including: ? Exposure to abuse or violence. ? Household financial strain. ? Mental health conditions you have.  Your daily health habits, including diet and exercise. Your health care provider will also:  Measure your weight, height, and blood pressure.  Do a physical exam, including a pelvic  and breast exam.  Perform blood tests and urine tests to check for: ? Urinary tract infection. ? Sexually transmitted infections (STIs). ? Low iron levels in your blood (anemia). ? Blood type and certain proteins on red blood cells (Rh antibodies). ? Infections and immunity to viruses, such as hepatitis B and rubella. ? HIV (human immunodeficiency virus).  Do an ultrasound to confirm your baby's growth and development and to help predict your estimated due date (EDD). This ultrasound is done with a probe that is inserted into the vagina (transvaginal ultrasound).  Discuss your options for genetic screening.  Give you information about how to keep yourself and your baby healthy, including: ? Nutrition and taking vitamins. ? Physical activity. ? How to manage pregnancy symptoms such as nausea and vomiting (morning sickness). ? Infections and substances that may be harmful to your baby and how to avoid them. ? Food safety. ? Dental care. ? Working. ? Travel. ? Warning signs to watch for and when to call your health care provider. How often will I have prenatal care visits? After your first prenatal care visit, you will have regular visits throughout your pregnancy. The visit schedule is often as follows:  Up to week 28 of pregnancy: once every 4 weeks.  28-36 weeks: once every 2 weeks.  After 36 weeks: every week until delivery. Some women may have visits more or less often depending on any underlying health conditions and the health of the baby. Keep all follow-up and prenatal care visits as told by your health care provider. This is important. What happens during routine prenatal care visits? Your health care provider will:    Measure your weight and blood pressure.  Check for fetal heart sounds.  Measure the height of your uterus in your abdomen (fundal height). This may be measured starting around week 20 of pregnancy.  Check the position of your baby inside your  uterus.  Ask questions about your diet, sleeping patterns, and whether you can feel the baby move.  Review warning signs to watch for and signs of labor.  Ask about any pregnancy symptoms you are having and how you are dealing with them. Symptoms may include: ? Headaches. ? Nausea and vomiting. ? Vaginal discharge. ? Swelling. ? Fatigue. ? Constipation. ? Any discomfort, including back or pelvic pain. Make a list of questions to ask your health care provider at your routine visits. What tests might I have during prenatal care visits? You may have blood, urine, and imaging tests throughout your pregnancy, such as:  Urine tests to check for glucose, protein, or signs of infection.  Glucose tests to check for a form of diabetes that can develop during pregnancy (gestational diabetes mellitus). This is usually done around week 24 of pregnancy.  An ultrasound to check your baby's growth and development and to check for birth defects. This is usually done around week 20 of pregnancy.  A test to check for group B strep (GBS) infection. This is usually done around week 36 of pregnancy.  Genetic testing. This may include blood or imaging tests, such as an ultrasound. Some genetic tests are done during the first trimester and some are done during the second trimester. What else can I expect during prenatal care visits? Your health care provider may recommend getting certain vaccines during pregnancy. These may include:  A yearly flu shot (annual influenza vaccine). This is especially important if you will be pregnant during flu season.  Tdap (tetanus, diphtheria, pertussis) vaccine. Getting this vaccine during pregnancy can protect your baby from whooping cough (pertussis) after birth. This vaccine may be recommended between weeks 27 and 36 of pregnancy. Later in your pregnancy, your health care provider may give you information about:  Childbirth and breastfeeding classes.  Choosing a  health care provider for your baby.  Umbilical cord banking.  Breastfeeding.  Birth control after your baby is born.  The hospital labor and delivery unit and how to tour it.  Registering at the hospital before you go into labor. Where to find more information  Office on Women's Health: LegalWarrants.gl  American Pregnancy Association: americanpregnancy.org  March of Dimes: marchofdimes.org Summary  Prenatal care helps you and your baby stay as healthy as possible during pregnancy.  Your first prenatal care visit will most likely be the longest.  You will have visits and tests throughout your pregnancy to monitor your health and your baby's health.  Bring a list of questions to your visits to ask your health care provider.  Make sure to keep all follow-up and prenatal care visits with your health care provider. This information is not intended to replace advice given to you by your health care provider. Make sure you discuss any questions you have with your health care provider. Document Released: 01/11/2003 Document Revised: 01/07/2017 Document Reviewed: 01/07/2017 Elsevier Interactive Patient Education  2019 [place] for Pregnant Women While you are pregnant, your body requires additional nutrition to help support your growing baby. You also have a higher need for some vitamins and minerals, such as folic acid, calcium, iron, and vitamin D. Eating a healthy, well-balanced diet is very important for your  health and your baby's health. Your need for extra calories varies for the three 26-monthsegments of your pregnancy (trimesters). For most women, it is recommended to consume:  150 extra calories a day during the first trimester.  300 extra calories a day during the second trimester.  300 extra calories a day during the third trimester. What are tips for following this plan?   Do not try to lose weight or go on a diet during pregnancy.  Limit your  overall intake of foods that have "empty calories." These are foods that have little nutritional value, such as sweets, desserts, candies, and sugar-sweetened beverages.  Eat a variety of foods (especially fruits and vegetables) to get a full range of vitamins and minerals.  Take a prenatal vitamin to help meet your additional vitamin and mineral needs during pregnancy, specifically for folic acid, iron, calcium, and vitamin D.  Remember to stay active. Ask your health care provider what types of exercise and activities are safe for you.  Practice good food safety and cleanliness. Wash your hands before you eat and after you prepare raw meat. Wash all fruits and vegetables well before peeling or eating. Taking these actions can help to prevent food-borne illnesses that can be very dangerous to your baby, such as listeriosis. Ask your health care provider for more information about listeriosis. What does 150 extra calories look like? Healthy options that provide 150 extra calories each day could be any of the following:  6-8 oz (170-230 g) of plain low-fat yogurt with  cup of berries.  1 apple with 2 teaspoons (11 g) of peanut butter.  Cut-up vegetables with  cup (60 g) of hummus.  8 oz (230 mL) or 1 cup of low-fat chocolate milk.  1 stick of string cheese with 1 medium orange.  1 peanut butter and jelly sandwich that is made with one slice of whole-wheat bread and 1 tsp (5 g) of peanut butter. For 300 extra calories, you could eat two of those healthy options each day. What is a healthy amount of weight to gain? The right amount of weight gain for you is based on your BMI before you became pregnant. If your BMI:  Was less than 18 (underweight), you should gain 28-40 lb (13-18 kg).  Was 18-24.9 (normal), you should gain 25-35 lb (11-16 kg).  Was 25-29.9 (overweight), you should gain 15-25 lb (7-11 kg).  Was 30 or greater (obese), you should gain 11-20 lb (5-9 kg). What if I am  having twins or multiples? Generally, if you are carrying twins or multiples:  You may need to eat 300-600 extra calories a day.  The recommended range for total weight gain is 25-54 lb (11-25 kg), depending on your BMI before pregnancy.  Talk with your health care provider to find out about nutritional needs, weight gain, and exercise that is right for you. What foods can I eat?  Grains All grains. Choose whole grains, such as whole-wheat bread, oatmeal, or brown rice. Vegetables All vegetables. Eat a variety of colors and types of vegetables. Remember to wash your vegetables well before peeling or eating. Fruits All fruits. Eat a variety of colors and types of fruit. Remember to wash your fruits well before peeling or eating. Meats and other protein foods Lean meats, including chicken, t[place] fish, and lean cuts of beef, veal, or pork. If you eat fish or seafood, choose options that are higher in omega-3 fatty acids and lower in mercury, such as salmon, herring,  mussels, trout, sardines, pollock, shrimp, crab, and lobster. Tofu. Tempeh. Beans. Eggs. Peanut butter and other nut butters. Make sure that all meats, poultry, and eggs are cooked to food-safe temperatures or "well-done." Two or more servings of fish are recommended each week in order to get the most benefits from omega-3 fatty acids that are found in seafood. Choose fish that are lower in mercury. You can find more information online:  [place]Gaming.ch Dairy Pasteurized milk and milk alternatives (such as almond milk). Pasteurized yogurt and pasteurized cheese. Cottage cheese. Sour cream. Beverages Water. Juices that contain 100% fruit juice or vegetable juice. Caffeine-free teas and decaffeinated coffee. Drinks that contain caffeine are okay to drink, but it is better to avoid caffeine. Keep your total caffeine intake to less than 200 mg each day (which is 12 oz or 355 mL of coffee, tea, or soda) or the limit as told by your health  care provider. Fats and oils Fats and oils are okay to include in moderation. Sweets and desserts Sweets and desserts are okay to include in moderation. Seasoning and other foods All pasteurized condiments. The items listed above may not be a complete list of recommended foods and beverages. Contact your dietitian for more options. What foods are not recommended? Vegetables Raw (unpasteurized) vegetable juices. Fruits Unpasteurized fruit juices. Meats and other protein foods Lunch meats, bologna, hot dogs, or other deli meats. (If you must eat those meats, reheat them until they are steaming hot.) Refrigerated pat, meat spreads from a meat counter, smoked seafood that is found in the refrigerated section of a store. Raw or undercooked meats, poultry, and eggs. Raw fish, such as sushi or sashimi. Fish that have high mercury content, such as tilefish, shark, swordfish, and king mackerel. To learn more about mercury in fish, talk with your health care provider or look for online resources, such as:  [place]Gaming.ch Dairy Raw (unpasteurized) milk and any foods that have raw milk in them. Soft cheeses, such as feta, queso blanco, queso fresco, Brie, Camembert cheeses, blue-veined cheeses, and Panela cheese (unless it is made with pasteurized milk, which must be stated on the label). Beverages Alcohol. Sugar-sweetened beverages, such as sodas, teas, or energy drinks. Seasoning and other foods Homemade fermented foods and drinks, such as pickles, sauerkraut, or kombucha drinks. (Store-bought pasteurized versions of these are okay.) Salads that are made in a store or deli, such as ham salad, chicken salad, egg salad, tuna salad, and seafood salad. The items listed above may not be a complete list of foods and beverages to avoid. Contact your dietitian for more information. Where to find more information To calculate the number of calories you need based on your height, weight, and activity level, you  can use an online calculator such as:  MobileTransition.ch To calculate how much weight you should gain during pregnancy, you can use an online pregnancy weight gain calculator such as:  StreamingFood.com.cy Summary  While you are pregnant, your body requires additional nutrition to help support your growing baby.  Eat a variety of foods, especially fruits and vegetables to get a full range of vitamins and minerals.  Practice good food safety and cleanliness. Wash your hands before you eat and after you prepare raw meat. Wash all fruits and vegetables well before peeling or eating. Taking these actions can help to prevent food-borne illnesses, such as listeriosis, that can be very dangerous to your baby.  Do not eat raw meat or fish. Do not eat fish that have high mercury  content, such as tilefish, shark, swordfish, and king mackerel. Do not eat unpasteurized (raw) dairy.  Take a prenatal vitamin to help meet your additional vitamin and mineral needs during pregnancy, specifically for folic acid, iron, calcium, and vitamin D. This information is not intended to replace advice given to you by your health care provider. Make sure you discuss any questions you have with your health care provider. Document Released: 10/23/2013 Document Revised: 10/05/2016 Document Reviewed: 10/05/2016 Elsevier Interactive Patient Education  2019 [employer]. Common Medications Safe in Pregnancy  Acne:      Constipation:  Benzoyl Peroxide     Colace  Clindamycin      Dulcolax Suppository  Topica Erythromycin     Fibercon  Salicylic Acid      Metamucil         Miralax AVOID:        Senakot   Accutane    Cough:  Retin-A       Cough Drops  Tetracycline      Phenergan w/ Codeine if Rx  Minocycline      Robitussin (Plain &  DM)  Antibiotics:     Crabs/Lice:  Ceclor       RID  Cephalosporins    AVOID:  E-Mycins      Kwell  Keflex  Macrobid/Macrodantin   Diarrhea:  Penicillin      Kao-Pectate  Zithromax      Imodium AD         PUSH FLUIDS AVOID:       Cipro     Fever:  Tetracycline      Tylenol (Regular or Extra  Minocycline       Strength)  Levaquin      Extra Strength-Do not          Exceed 8 tabs/24 hrs Caffeine:        '200mg'$ /day (equiv. To 1 cup of coffee or  approx. 3 12 oz sodas)         Gas: Cold/Hayfever:       Gas-X  Benadryl      Mylicon  Claritin       Phazyme  **Claritin-D        Chlor-Trimeton    Headaches:  Dimetapp      ASA-Free Excedrin  Drixoral-Non-Drowsy     Cold Compress  Mucinex (Guaifenasin)     Tylenol (Regular or Extra  Sudafed/Sudafed-12 Hour     Strength)  **Sudafed PE Pseudoephedrine   Tylenol Cold & Sinus     Vicks Vapor Rub  Zyrtec  **AVOID if Problems With Blood Pressure         Heartburn: Avoid lying down for at least 1 hour after meals  Aciphex      Maalox     Rash:  Milk of Magnesia     Benadryl    Mylanta       1% Hydrocortisone Cream  Pepcid  Pepcid Complete   Sleep Aids:  Prevacid      Ambien   Prilosec       Benadryl  Rolaids       Chamomile Tea  Tums (Limit 4/day)     Unisom  Zantac       Tylenol PM         Warm milk-add vanilla or  Hemorrhoids:       Sugar for taste  Anusol/Anusol H.C.  (RX: Analapram 2.5%)  Sugar Substitutes:  Hydrocortisone OTC     Ok in moderation  Preparation H  Tucks        Vaseline lotion applied to tissue with wiping    Herpes:     Throat:  Acyclovir      Oragel  Famvir  Valtrex     Vaccines:         Flu Shot Leg Cramps:       *Gardasil  Benadryl      Hepatitis A         Hepatitis B Nasal Spray:       Pneumovax  Saline Nasal Spray     Polio Booster         Tetanus Nausea:       Tuberculosis test or PPD  Vitamin B6 25 mg TID   AVOID:    Dramamine      *Gardasil  Emetrol       Live  Poliovirus  Ginger Root 250 mg QID    MMR (measles, mumps &  High Complex Carbs @ Bedtime    rebella)  Sea Bands-Accupressure    Varicella (Chickenpox)  Unisom 1/2 tab TID     *No known complications           If received before Pain:         Known pregnancy;   Darvocet       Resume series after  Lortab        Delivery  Percocet    Yeast:   Tramadol      Femstat  Tylenol 3      Gyne-lotrimin  Ultram       Monistat  Vicodin           MISC:         All Sunscreens           Hair Coloring/highlights          Insect Repellant's          (Including DEET)         Mystic Tans

## 2018-01-08 NOTE — Progress Notes (Signed)
TRANSFER IN OB HISTORY AND PHYSICAL  SUBJECTIVE:       Christy Schmidt is a 22 y.o. 322P0 female, Patient's last menstrual period was 07/16/2017 (approximate)., Estimated Date of Delivery: 04/23/18, [id number], presents today for Transition of Prenatal Care. EPIC data migration from outside records  Not accomplished today. Pt has has care at health department.Realse of medical records completed today.  Complaints today include: none      Gynecologic History Patient's last menstrual period was 07/16/2017 (approximate). Normal Contraception: none Last Pap: 2019 per pt. Results were: normal  Obstetric History OB History  Gravida Para Term Preterm AB Living  2            SAB TAB Ectopic Multiple Live Births               # Outcome Date GA Lbr Len/2nd Weight Sex Delivery Anes PTL Lv  2 Current           1 Gravida             Past Medical History:  Diagnosis Date  . [id number](478.2Headache(784.0)     Past Surgical History:  Procedure Laterality Date  . OTHER SURGICAL HISTORY Right 2010   Bone Removed on Right Hand 3rd Digit    Current Outpatient Medications on File Prior to Visit  Medication Sig Dispense Refill  . ferrous sulfate 325 (65 FE) MG tablet Take by mouth.    . Prenatal Vit-Fe Fumarate-FA (PNV PRENATAL PLUS MULTIVITAMIN) 27-1 MG TABS Take by mouth.     No current facility-administered medications on file prior to visit.     No Known Allergies  Social History   Socioeconomic History  . Marital status: Divorced    Spouse name: Not on file  . Number of children: Not on file  . Years of education: Not on file  . Highest education level: Not on file  Occupational History  . Not on file  Social Needs  . Financial resource strain: Not on file  . Food insecurity:    Worry: Not on file    Inability: Not on file  . Transportation needs:    Medical: Not on file    Non-medical: Not on file  Tobacco Use  . Smoking status: Former Games developermoker  . Smokeless tobacco: Never Used  Substance  and Sexual Activity  . Alcohol use: No  . Drug use: No  . Sexual activity: Yes    Birth control/protection: None  Lifestyle  . Physical activity:    Days per week: Not on file    Minutes per session: Not on file  . Stress: Not on file  Relationships  . Social connections:    Talks on phone: Not on file    Gets together: Not on file    Attends religious service: Not on file    Active member of club or organization: Not on file    Attends meetings of clubs or organizations: Not on file    Relationship status: Not on file  . Intimate partner violence:    Fear of current or ex partner: Not on file    Emotionally abused: Not on file    Physically abused: Not on file    Forced sexual activity: Not on file  Other Topics Concern  . Not on file  Social History Narrative  . Not on file    Family History  Problem Relation Age of Onset  . Migraines Mother   . Depression Mother   . Migraines Maternal  Grandmother   . Migraines Cousin     The following portions of the patient's history were reviewed and updated as appropriate: allergies, current medications, past OB history, past medical history, past surgical history, past family history, past social history, and problem list.    OBJECTIVE: Initial Physical Exam (New OB)  GENERAL APPEARANCE: alert, well appearing, in no apparent distress, oriented to person, place and time HEAD: normocephalic, atraumatic MOUTH: mucous membranes moist, pharynx normal without lesions THYROID: no thyromegaly or masses present BREASTS: no masses noted, no significant tenderness, no palpable axillary nodes, no skin changes LUNGS: clear to auscultation, no wheezes, rales or rhonchi, symmetric air entry HEART: regular rate and rhythm, no murmurs ABDOMEN: soft, nontender, nondistended, no abnormal masses, no epigastric pain EXTREMITIES: no redness or tenderness in the calves or thighs SKIN: normal coloration and turgor, no rashes LYMPH NODES: no  adenopathy palpable NEUROLOGIC: alert, oriented, normal speech, no focal findings or movement disorder noted  PELVIC EXAM not indicated  FHT: 130  ASSESSMENT: Normal pregnancy  PLAN: Prenatal care reviewed. Discussed midwifery vs md care. Pt request midwifery care. Pt state she has been getting care at health department. She had genetic testing , female fetus. She state she had anatomy scan and blood work completed. She denies any medical problems, she denies smoking, alcholhol , and drug use. She is currently taking PNV and iron. She admits to hx. Of shoulder dystocia , postpartum hemorrhage that required blood transfusion and anemia with first pregnancy. Discussed return in 3 wks for 28 wk labs. She verbalizes and agrees to plan.    Doreene Burke, CNM

## 2018-01-21 ENCOUNTER — Observation Stay
Admission: EM | Admit: 2018-01-21 | Discharge: 2018-01-21 | Disposition: A | Discharge status: Home / Self Care | Payer: Medicaid Other | Attending: Certified Nurse Midwife | Admitting: Certified Nurse Midwife

## 2018-01-21 ENCOUNTER — Other Ambulatory Visit: Payer: Self-pay

## 2018-01-21 DIAGNOSIS — O26892 Other specified pregnancy related conditions, second trimester: Principal | ICD-10-CM | POA: Insufficient documentation

## 2018-01-21 DIAGNOSIS — Z3A26 26 weeks gestation of pregnancy: Secondary | ICD-10-CM | POA: Insufficient documentation

## 2018-01-21 DIAGNOSIS — R1030 Lower abdominal pain, unspecified: Secondary | ICD-10-CM | POA: Diagnosis not present

## 2018-01-21 DIAGNOSIS — R52 Pain, unspecified: Secondary | ICD-10-CM | POA: Diagnosis not present

## 2018-01-21 DIAGNOSIS — R000 Tachycardia, unspecified: Secondary | ICD-10-CM | POA: Diagnosis not present

## 2018-01-21 DIAGNOSIS — R102 Pelvic and perineal pain: Secondary | ICD-10-CM | POA: Insufficient documentation

## 2018-01-21 DIAGNOSIS — R109 Unspecified abdominal pain: Secondary | ICD-10-CM | POA: Diagnosis not present

## 2018-01-21 DIAGNOSIS — R1084 Generalized abdominal pain: Secondary | ICD-10-CM | POA: Diagnosis not present

## 2018-01-21 DIAGNOSIS — Z79899 Other long term (current) drug therapy: Secondary | ICD-10-CM | POA: Diagnosis not present

## 2018-01-21 DIAGNOSIS — M7918 Myalgia, other site: Secondary | ICD-10-CM | POA: Insufficient documentation

## 2018-01-21 LAB — URINALYSIS, ROUTINE W REFLEX MICROSCOPIC
BACTERIA UA: NONE SEEN
BILIRUBIN URINE: NEGATIVE
Glucose, UA: NEGATIVE mg/dL
Hgb urine dipstick: NEGATIVE
KETONES UR: 20 mg/dL — AB
LEUKOCYTES UA: NEGATIVE
Nitrite: NEGATIVE
PROTEIN: 30 mg/dL — AB
Specific Gravity, Urine: 1.018 (ref 1.005–1.030)
pH: 7 (ref 5.0–8.0)

## 2018-01-21 MED ORDER — ACETAMINOPHEN 500 MG PO TABS
1000.0000 mg | ORAL_TABLET | Freq: Once | ORAL | Status: AC
  Administered 2018-01-21: 1000 mg via ORAL
  Filled 2018-01-21: qty 2

## 2018-01-21 NOTE — OB Triage Note (Signed)
Patient states she was having a constant lower abdominal pain that lasted 15 minutes and was radiating down her legs. Rated pain 10/10. Pain has subsided, but lower abdominal area is sore at this time. Also reports milky, white vaginal discharge. Denies vaginal irritation or burning.

## 2018-01-21 NOTE — OB Triage Note (Signed)
   L&D OB Triage Note  SUBJECTIVE Christy Schmidt is a 22 y.o. G2P0 female at 6353w6d, EDD Estimated Date of Delivery: 04/23/18 who presented to triage with complaints of round ligament pain. She describes it has lower abdominal pain that lasted about 15 min. , it radiated down her leg.   OB History  Gravida Para Term Preterm AB Living  2 0 0 0 0 0  SAB TAB Ectopic Multiple Live Births  0 0 0 0 0    # Outcome Date GA Lbr Len/2nd Weight Sex Delivery Anes PTL Lv  2 Current           1 Gravida             Medications Prior to Admission  Medication Sig Dispense Refill Last Dose  . ferrous sulfate 325 (65 FE) MG tablet Take by mouth.     . Prenatal Vit-Fe Fumarate-FA (PNV PRENATAL PLUS MULTIVITAMIN) 27-1 MG TABS Take by mouth.        OBJECTIVE  Nursing Evaluation:   BP (!) 77/64 (BP Location: Left Arm)   Pulse (!) 105   Temp 98.5 F (36.9 C) (Oral)   Resp 20   Ht 5\' 6"  (1.676 m)   Wt 90.3 kg   LMP 07/16/2017 (Approximate)   BMI 32.12 kg/m    Findings: Round ligament pain  NST was performed and has been reviewed by me.  NST INTERPRETATION: Category I   Baseline 125 Moderate variability No decelerations Accelerations present   Toco: no contractions    ASSESSMENT Impression:  1.  Pregnancy:  G2P0 at 5953w6d , EDD Estimated Date of Delivery: 04/23/18, round ligament pain, musculoskeletal pain of pregnancy  2.  NST:  Category I  PLAN 1. Reassurance given 2. Discharge home with standard labor precautions given to return to L&D or call the office for problems. 3. Continue routine prenatal care.  Doreene BurkeAnnie Jonanthan Bolender, CNM

## 2018-01-22 NOTE — L&D Delivery Note (Signed)
Delivery Note  0424 Called in room to see patient, reports pelvic pressure and the urge to push. SVE: 10/100/+2, vertex.   Effective maternal pushing efforts noted. Spontaneous vaginal birth of liveborn female infant in right occiput anterior position. Mild shoulder dystocia resolved immediately with McRoberts and suprapubic pressure. Pitocin bolus started with birth of anterior should. Infant immediately to maternal abdomen. Delayed cord clamping. Three (3) vessel cord and cord blood collected. AGPARs: 8, 9. Weight pending.   Spontaneous delivery of intact placenta at 0431 after gentle cord traction and fundal rub. Placenta to pathology for maternal fever in labor, see orders. Uterus firm. Rubra small. Perineum intact, no other lacerations present. Vault check completed under adequate epidural anesthesia. EBL: 500 ml. 800 mcg cytotec placed rectal due to history of postpartum hemorrhage in previous birth.     Initiate routine postpartum care and orders. Mom to postpartum.  Baby to Couplet care / Skin to Skin.  FOB present at bedside and overjoyed with the birth of "Zhariya".   Gunnar Bulla, CNM [facility], [facility] 04/08/2018, 4:50 AM

## 2018-01-29 ENCOUNTER — Encounter: Payer: Medicaid Other | Admitting: Obstetrics and Gynecology

## 2018-01-29 ENCOUNTER — Other Ambulatory Visit: Payer: Medicaid Other

## 2018-01-29 ENCOUNTER — Telehealth: Payer: Self-pay | Admitting: *Deleted

## 2018-01-29 NOTE — Telephone Encounter (Signed)
Called pt LM, due to here missing her appt today w/MNS.

## 2018-01-30 ENCOUNTER — Telehealth: Payer: Self-pay | Admitting: Certified Nurse Midwife

## 2018-01-30 ENCOUNTER — Other Ambulatory Visit: Payer: Medicaid Other

## 2018-01-30 ENCOUNTER — Ambulatory Visit (INDEPENDENT_AMBULATORY_CARE_PROVIDER_SITE_OTHER): Payer: Medicaid Other | Admitting: Certified Nurse Midwife

## 2018-01-30 VITALS — BP 118/70 | HR 98 | Wt 205.1 lb

## 2018-01-30 DIAGNOSIS — Z131 Encounter for screening for diabetes mellitus: Secondary | ICD-10-CM | POA: Diagnosis not present

## 2018-01-30 DIAGNOSIS — Z130 Encounter for screening for diseases of the blood and blood-forming organs and certain disorders involving the immune mechanism: Secondary | ICD-10-CM | POA: Diagnosis not present

## 2018-01-30 DIAGNOSIS — Z3493 Encounter for supervision of normal pregnancy, unspecified, third trimester: Secondary | ICD-10-CM | POA: Diagnosis not present

## 2018-01-30 LAB — POCT URINALYSIS DIPSTICK OB
Bilirubin, UA: NEGATIVE
Blood, UA: NEGATIVE
Glucose, UA: NEGATIVE
Ketones, UA: NEGATIVE
Nitrite, UA: NEGATIVE
PH UA: 7 (ref 5.0–8.0)
POC,PROTEIN,UA: NEGATIVE
Spec Grav, UA: 1.015 (ref 1.010–1.025)
Urobilinogen, UA: 0.2 E.U./dL

## 2018-01-30 NOTE — Progress Notes (Signed)
ROB.  Patient c/o ankles swelling usually at night time x2 weeks.

## 2018-01-30 NOTE — Patient Instructions (Signed)
WHAT OB PATIENTS CAN EXPECT   Confirmation of pregnancy and ultrasound ordered if medically indicated-[redacted] weeks gestation  New OB (NOB) intake with nurse and New OB (NOB) labs- [redacted] weeks gestation  New OB (NOB) physical examination with provider- 11/[redacted] weeks gestation  Flu vaccine-[redacted] weeks gestation  Anatomy scan-[redacted] weeks gestation  Glucose tolerance test, blood work to test for anemia, T-dap vaccine-[redacted] weeks gestation  Vaginal swabs/cultures-STD/Group B strep-[redacted] weeks gestation  Appointments every 4 weeks until 28 weeks  Every 2 weeks from 28 weeks until 36 weeks  Weekly visits from 36 weeks until delivery  Pain Relief During Labor and Delivery Many things can cause pain during labor and delivery, including:  Pressure on bones and ligaments due to the baby moving through the pelvis.  Stretching of tissues due to the baby moving through the birth canal.  Muscle tension due to anxiety or nervousness.  The uterus tightening (contracting) and relaxing to help move the baby. There are many ways to deal with the pain of labor and delivery. They include:  Taking prenatal classes. Taking these classes helps you know what to expect during your baby's birth. What you learn will increase your confidence and decrease your anxiety.  Practicing relaxation techniques or doing relaxing activities, such as: ? Focused breathing. ? Meditation. ? Visualization. ? Aroma therapy. ? Listening to your favorite music. ? Hypnosis.  Taking a warm shower or bath (hydrotherapy). This may: ? Provide comfort and relaxation. ? Lessen your perception of pain. ? Decrease the amount of pain medicine needed. ? Decrease the length of labor.  Getting a massage or counterpressure on your back.  Applying warm packs or ice packs.  Changing positions often, moving around, or using a birthing ball.  Getting: ? Pain medicine through an IV or injection into a muscle. ? Pain medicine inserted into your  spinal column. ? Injections of sterile water just under the skin on your lower back (intradermal injections). ? Laughing gas (nitrous oxide). Discuss your pain control options with your health care provider during your prenatal visits. Explore the options offered by your hospital or birth center. What kinds of medicine are available? There are two kinds of medicines that can be used to relieve pain during labor and delivery:  Analgesics. These medicines decrease pain without causing you to lose feeling or the ability to move your muscles.  Anesthetics. These medicines block feeling in the body and can decrease your ability to move freely. Both of these kinds of medicine can cause minor side effects, such as nausea, trouble concentrating, and sleepiness. They can also decrease the baby's heart rate before birth and affect the baby's breathing rate after birth. For this reason, health care providers are careful about when and how much medicine is given. What are specific medicines and procedures that provide pain relief? Local Anesthetics Local anesthetics are used to numb a small area of the body. They may be used along with another kind of anesthetic or used to numb the nerves of the vagina, cervix, and perineum during the second stage of labor. General Anesthetics General anesthetics cause you to lose consciousness so you do not feel pain. They are usually only used for an emergency cesarean delivery. General anesthetics are given through an IV tube and a mask. Pudendal Block A pudendal block is a form of local anesthetic. It may be used to relieve the pain associated with pushing or stretching of the perineum at the time of delivery or to further numb the  perineum. A pudendal block is done by injecting numbing medicine through the vaginal wall into a nerve in the pelvis. Epidural Analgesia Epidural analgesia is given through a flexible IV catheter that is inserted into the lower back. Numbing  medicine is delivered continuously to the area near your spinal column nerves (epidural space). After having this type of analgesia, you may be able to move your legs but you most likely will not be able to walk. Depending on the amount of medicine given, you may lose all feeling in the lower half of your body, or you may retain some level of sensation, including the urge to push. Epidural analgesia can be used to provide pain relief for a vaginal birth. Spinal Block A spinal block is similar to epidural analgesia, but the medicine is injected into the spinal fluid instead of the epidural space. A spinal block is only given once. It starts to relieve pain quickly, but the pain relief lasts only 1-6 hours. Spinal blocks can be used for cesarean deliveries. Combined Spinal-Epidural (CSE) Block A CSE block combines the effects of a spinal block and epidural analgesia. The spinal block works quickly to block all pain. The epidural analgesia provides continuous pain relief, even after the effects of the spinal block have worn off. This information is not intended to replace advice given to you by your health care provider. Make sure you discuss any questions you have with your health care provider. Document Released: 04/26/2008 Document Revised: 06/17/2015 Document Reviewed: 06/01/2015 Elsevier Interactive Patient Education  2019 [employer]. Third Trimester of Pregnancy  The third trimester is from week 28 through week 40 (months 7 through 9). This trimester is when your unborn baby (fetus) is growing very fast. At the end of the ninth month, the unborn baby is about 20 inches in length. It weighs about 6-10 pounds. Follow these instructions at home: Medicines  Take over-the-counter and prescription medicines only as told by your doctor. Some medicines are safe and some medicines are not safe during pregnancy.  Take a prenatal vitamin that contains at least 600 micrograms (mcg) of folic acid.  If you  have trouble pooping (constipation), take medicine that will make your stool soft (stool softener) if your doctor approves. Eating and drinking   Eat regular, healthy meals.  Avoid raw meat and uncooked cheese.  If you get low calcium from the food you eat, talk to your doctor about taking a daily calcium supplement.  Eat four or five small meals rather than three large meals a day.  Avoid foods that are high in fat and sugars, such as fried and sweet foods.  To prevent constipation: ? Eat foods that are high in fiber, like fresh fruits and vegetables, whole grains, and beans. ? Drink enough fluids to keep your pee (urine) clear or pale yellow. Activity  Exercise only as told by your doctor. Stop exercising if you start to have cramps.  Avoid heavy lifting, wear low heels, and sit up straight.  Do not exercise if it is too hot, too humid, or if you are in a place of great height (high altitude).  You may continue to have sex unless your doctor tells you not to. Relieving pain and discomfort  Wear a good support bra if your breasts are tender.  Take frequent breaks and rest with your legs raised if you have leg cramps or low back pain.  Take warm water baths (sitz baths) to soothe pain or discomfort caused by hemorrhoids.  Use hemorrhoid cream if your doctor approves.  If you develop puffy, bulging veins (varicose veins) in your legs: ? Wear support hose or compression stockings as told by your doctor. ? Raise (elevate) your feet for 15 minutes, 3-4 times a day. ? Limit salt in your food. Safety  Wear your seat belt when driving.  Make a list of emergency phone numbers, including numbers for family, friends, the hospital, and police and fire departments. Preparing for your baby's arrival To prepare for the arrival of your baby:  Take prenatal classes.  Practice driving to the hospital.  Visit the hospital and tour the maternity area.  Talk to your work about taking  leave once the baby comes.  Pack your hospital bag.  Prepare the baby's room.  Go to your doctor visits.  Buy a rear-facing car seat. Learn how to install it in your car. General instructions  Do not use hot tubs, steam rooms, or saunas.  Do not use any products that contain nicotine or tobacco, such as cigarettes and e-cigarettes. If you need help quitting, ask your doctor.  Do not drink alcohol.  Do not douche or use tampons or scented sanitary pads.  Do not cross your legs for long periods of time.  Do not travel for long distances unless you must. Only do so if your doctor says it is okay.  Visit your dentist if you have not gone during your pregnancy. Use a soft toothbrush to brush your teeth. Be gentle when you floss.  Avoid cat litter boxes and soil used by cats. These carry germs that can cause birth defects in the baby and can cause a loss of your baby (miscarriage) or stillbirth.  Keep all your prenatal visits as told by your doctor. This is important. Contact a doctor if:  You are not sure if you are in labor or if your water has broken.  You are dizzy.  You have mild cramps or pressure in your lower belly.  You have a nagging pain in your belly area.  You continue to feel sick to your stomach, you throw up, or you have watery poop.  You have bad smelling fluid coming from your vagina.  You have pain when you pee. Get help right away if:  You have a fever.  You are leaking fluid from your vagina.  You are spotting or bleeding from your vagina.  You have severe belly cramps or pain.  You lose or gain weight quickly.  You have trouble catching your breath and have chest pain.  You notice sudden or extreme puffiness (swelling) of your face, hands, ankles, feet, or legs.  You have not felt the baby move in over an hour.  You have severe headaches that do not go away with medicine.  You have trouble seeing.  You are leaking, or you are having a  gush of fluid, from your vagina before you are 37 weeks.  You have regular belly spasms (contractions) before you are 37 weeks. Summary  The third trimester is from week 28 through week 40 (months 7 through 9). This time is when your unborn baby is growing very fast.  Follow your doctor's advice about medicine, food, and activity.  Get ready for the arrival of your baby by taking prenatal classes, getting all the baby items ready, preparing the baby's room, and visiting your doctor to be checked.  Get help right away if you are bleeding from your vagina, or you have chest pain  and trouble catching your breath, or if you have not felt your baby move in over an hour. This information is not intended to replace advice given to you by your health care provider. Make sure you discuss any questions you have with your health care provider. Document Released: 04/04/2009 Document Revised: 02/14/2016 Document Reviewed: 02/14/2016 Elsevier Interactive Patient Education  2019 [employer].

## 2018-01-30 NOTE — Telephone Encounter (Signed)
Patient was extremely hostile while checking out the patient complained that she was here for 2 hrs because her orders were not put in. I then tried to schedule the patient for her next appointment and she would not pick an appointment time nor make eye contact with me. I then asked the patient if she would like to speak with the office manager because she seemed upset. She declined. I scheduled the patients apt and handed her, her print out. She snatched the paper out of my hand. I told the patient to have "Have a great day" the patient responded with "I will".

## 2018-01-31 ENCOUNTER — Other Ambulatory Visit: Payer: Self-pay | Admitting: Obstetrics and Gynecology

## 2018-01-31 LAB — GLUCOSE, 1 HOUR GESTATIONAL: Gestational Diabetes Screen: 128 mg/dL (ref 65–139)

## 2018-01-31 LAB — CBC
Hematocrit: 27.5 % — ABNORMAL LOW (ref 34.0–46.6)
Hemoglobin: 9.6 g/dL — ABNORMAL LOW (ref 11.1–15.9)
MCH: 30.4 pg (ref 26.6–33.0)
MCHC: 34.9 g/dL (ref 31.5–35.7)
MCV: 87 fL (ref 79–97)
Platelets: 224 10*3/uL (ref 150–450)
RBC: 3.16 x10E6/uL — ABNORMAL LOW (ref 3.77–5.28)
RDW: 12.9 % (ref 11.7–15.4)
WBC: 10.8 10*3/uL (ref 3.4–10.8)

## 2018-01-31 LAB — RPR: RPR Ser Ql: NONREACTIVE

## 2018-01-31 NOTE — Progress Notes (Signed)
ROB-Doing well except for intermittent lower leg swelling-increased by sitting at work. Discussed home treatment measures including use of abdominal support and compression stockings; handout given. 28 week labs today. Declines TDaP. Blood transfusion consent reviewed and signed. Third trimester education including intrapartum pain management, infant feeding, and postpartum contraception options; see AVS. Anticipatory guidance regarding course of prenatal care. Reviewed red flag symptoms and when to call. RTC x 2 weeks for ROB or sooner if needed.

## 2018-02-05 ENCOUNTER — Telehealth: Payer: Self-pay | Admitting: Obstetrics and Gynecology

## 2018-02-05 ENCOUNTER — Telehealth: Payer: Self-pay | Admitting: *Deleted

## 2018-02-05 NOTE — Telephone Encounter (Signed)
Patient called complaining of severe Flu symptoms. Painful coughing, vomiting, diarrhea, fever and chills, sore throat, a nurse was called and she spoke w/ a provider. Provider advised patient to go to ED or urgent care. "Patient was extremely hostile and difficult to speak with. Patient talked over me, so I advised a nurse to speak with her. Patient raised her voice and insulted nurse and practice several times. No other information disclosed. Please advise.

## 2018-02-05 NOTE — Telephone Encounter (Signed)
Pt called" stating she thinks she has the flu", she also c/o sore throat, vomiting,diarrhea I spoke with pt and advised her to go to urgent care or ed, pt got very upset with me and stated "I didn't care about her ", I told her that was not true, she proceeded to say she has seen [place] for 2 pregnancies and I said yes that is correct, however she has only been here for 2 visits this current pregnancy, pt voiced understanding about urgent care or ED

## 2018-02-12 ENCOUNTER — Encounter: Payer: Medicaid Other | Admitting: Certified Nurse Midwife

## 2018-02-14 ENCOUNTER — Ambulatory Visit (INDEPENDENT_AMBULATORY_CARE_PROVIDER_SITE_OTHER): Payer: Medicaid Other | Admitting: Certified Nurse Midwife

## 2018-02-14 ENCOUNTER — Encounter: Payer: Self-pay | Admitting: Certified Nurse Midwife

## 2018-02-14 VITALS — BP 121/68 | HR 89 | Wt 204.0 lb

## 2018-02-14 DIAGNOSIS — Z3493 Encounter for supervision of normal pregnancy, unspecified, third trimester: Secondary | ICD-10-CM

## 2018-02-14 LAB — POCT URINALYSIS DIPSTICK OB
Bilirubin, UA: NEGATIVE
Blood, UA: NEGATIVE
Glucose, UA: NEGATIVE
Ketones, UA: NEGATIVE
Leukocytes, UA: NEGATIVE
Nitrite, UA: NEGATIVE
POC,PROTEIN,UA: NEGATIVE
Spec Grav, UA: 1.015 (ref 1.010–1.025)
Urobilinogen, UA: 0.2 E.U./dL
pH, UA: 6 (ref 5.0–8.0)

## 2018-02-14 MED ORDER — TETANUS-DIPHTH-ACELL PERTUSSIS 5-2.5-18.5 LF-MCG/0.5 IM SUSP
0.5000 mL | Freq: Once | INTRAMUSCULAR | Status: AC
  Administered 2018-02-14: 0.5 mL via INTRAMUSCULAR

## 2018-02-14 NOTE — Addendum Note (Signed)
Addended by: Brooke DareSICK, Joeph Szatkowski L on: 02/14/2018 03:31 PM   Modules accepted: Orders

## 2018-02-14 NOTE — Progress Notes (Signed)
ROB doing well. NO complaints. Feels good movement. Is requesting Tdap today. States after doing research and talking to family she has decided to get TDAp. Follow up 2 wk.   Doreene BurkeAnnie Cosandra Plouffe, CNM

## 2018-02-14 NOTE — Patient Instructions (Signed)
Braxton Hicks Contractions Contractions of the uterus can occur throughout pregnancy, but they are not always a sign that you are in labor. You may have practice contractions called Braxton Hicks contractions. These false labor contractions are sometimes confused with true labor. What are Braxton Hicks contractions? Braxton Hicks contractions are tightening movements that occur in the muscles of the uterus before labor. Unlike true labor contractions, these contractions do not result in opening (dilation) and thinning of the cervix. Toward the end of pregnancy (32-34 weeks), Braxton Hicks contractions can happen more often and may become stronger. These contractions are sometimes difficult to tell apart from true labor because they can be very uncomfortable. You should not feel embarrassed if you go to the hospital with false labor. Sometimes, the only way to tell if you are in true labor is for your health care provider to look for changes in the cervix. The health care provider will do a physical exam and may monitor your contractions. If you are not in true labor, the exam should show that your cervix is not dilating and your water has not broken. If there are no other health problems associated with your pregnancy, it is completely safe for you to be sent home with false labor. You may continue to have Braxton Hicks contractions until you go into true labor. How to tell the difference between true labor and false labor True labor  Contractions last 30-70 seconds.  Contractions become very regular.  Discomfort is usually felt in the top of the uterus, and it spreads to the lower abdomen and low back.  Contractions do not go away with walking.  Contractions usually become more intense and increase in frequency.  The cervix dilates and gets thinner. False labor  Contractions are usually shorter and not as strong as true labor contractions.  Contractions are usually irregular.  Contractions  are often felt in the front of the lower abdomen and in the groin.  Contractions may go away when you walk around or change positions while lying down.  Contractions get weaker and are shorter-lasting as time goes on.  The cervix usually does not dilate or become thin. Follow these instructions at home:   Take over-the-counter and prescription medicines only as told by your health care provider.  Keep up with your usual exercises and follow other instructions from your health care provider.  Eat and drink lightly if you think you are going into labor.  If Braxton Hicks contractions are making you uncomfortable: ? Change your position from lying down or resting to walking, or change from walking to resting. ? Sit and rest in a tub of warm water. ? Drink enough fluid to keep your urine pale yellow. Dehydration may cause these contractions. ? Do slow and deep breathing several times an hour.  Keep all follow-up prenatal visits as told by your health care provider. This is important. Contact a health care provider if:  You have a fever.  You have continuous pain in your abdomen. Get help right away if:  Your contractions become stronger, more regular, and closer together.  You have fluid leaking or gushing from your vagina.  You pass blood-tinged mucus (bloody show).  You have bleeding from your vagina.  You have low back pain that you never had before.  You feel your baby's head pushing down and causing pelvic pressure.  Your baby is not moving inside you as much as it used to. Summary  Contractions that occur before labor are   called Braxton Hicks contractions, false labor, or practice contractions.  Braxton Hicks contractions are usually shorter, weaker, farther apart, and less regular than true labor contractions. True labor contractions usually become progressively stronger and regular, and they become more frequent.  Manage discomfort from Braxton Hicks contractions  by changing position, resting in a warm bath, drinking plenty of water, or practicing deep breathing. This information is not intended to replace advice given to you by your health care provider. Make sure you discuss any questions you have with your health care provider. Document Released: 05/24/2016 Document Revised: 10/23/2016 Document Reviewed: 05/24/2016 Elsevier Interactive Patient Education  2019 [employer]  

## 2018-02-27 ENCOUNTER — Encounter: Payer: Medicaid Other | Admitting: Certified Nurse Midwife

## 2018-02-28 ENCOUNTER — Ambulatory Visit (INDEPENDENT_AMBULATORY_CARE_PROVIDER_SITE_OTHER): Payer: Medicaid Other | Admitting: Certified Nurse Midwife

## 2018-02-28 VITALS — BP 122/70 | HR 104 | Wt 207.9 lb

## 2018-02-28 DIAGNOSIS — Z3493 Encounter for supervision of normal pregnancy, unspecified, third trimester: Secondary | ICD-10-CM

## 2018-02-28 LAB — POCT URINALYSIS DIPSTICK OB
BILIRUBIN UA: NEGATIVE
Blood, UA: NEGATIVE
Glucose, UA: NEGATIVE
Ketones, UA: NEGATIVE
Leukocytes, UA: NEGATIVE
Nitrite, UA: NEGATIVE
POC,PROTEIN,UA: NEGATIVE
Spec Grav, UA: 1.015 (ref 1.010–1.025)
Urobilinogen, UA: 0.2 E.U./dL
pH, UA: 6.5 (ref 5.0–8.0)

## 2018-02-28 NOTE — Progress Notes (Signed)
ROB-No complaints.  

## 2018-02-28 NOTE — Patient Instructions (Signed)

## 2018-03-02 NOTE — Progress Notes (Signed)
ROB-Doing well. Discussed use of abdominal support and compression stockings in pregnancy; handouts given. Advised to submit FMLA paperwork to the front desk. Anticipatory guidance regarding course of prenatal care. Reviewed red flag symptoms and when to call. RTC x 2 weeks for ROB or sooner if needed.

## 2018-03-06 ENCOUNTER — Encounter: Payer: Medicaid Other | Admitting: Certified Nurse Midwife

## 2018-03-12 ENCOUNTER — Observation Stay
Admission: EM | Admit: 2018-03-12 | Discharge: 2018-03-12 | Disposition: A | Discharge status: Home / Self Care | Payer: Medicaid Other | Attending: Certified Nurse Midwife | Admitting: Certified Nurse Midwife

## 2018-03-12 ENCOUNTER — Other Ambulatory Visit: Payer: Self-pay

## 2018-03-12 DIAGNOSIS — O99013 Anemia complicating pregnancy, third trimester: Principal | ICD-10-CM | POA: Insufficient documentation

## 2018-03-12 DIAGNOSIS — D649 Anemia, unspecified: Secondary | ICD-10-CM | POA: Insufficient documentation

## 2018-03-12 DIAGNOSIS — O212 Late vomiting of pregnancy: Secondary | ICD-10-CM | POA: Diagnosis not present

## 2018-03-12 DIAGNOSIS — Z3A34 34 weeks gestation of pregnancy: Secondary | ICD-10-CM | POA: Diagnosis not present

## 2018-03-12 LAB — URINALYSIS, COMPLETE (UACMP) WITH MICROSCOPIC
BACTERIA UA: NONE SEEN
Bilirubin Urine: NEGATIVE
Glucose, UA: NEGATIVE mg/dL
Hgb urine dipstick: NEGATIVE
Ketones, ur: 20 mg/dL — AB
Nitrite: NEGATIVE
Protein, ur: NEGATIVE mg/dL
Specific Gravity, Urine: 1.016 (ref 1.005–1.030)
pH: 7 (ref 5.0–8.0)

## 2018-03-12 LAB — WET PREP, GENITAL
Clue Cells Wet Prep HPF POC: NONE SEEN
Sperm: NONE SEEN
Trich, Wet Prep: NONE SEEN
Yeast Wet Prep HPF POC: NONE SEEN

## 2018-03-12 LAB — FETAL FIBRONECTIN: Fetal Fibronectin: POSITIVE — AB

## 2018-03-12 MED ORDER — MECLIZINE HCL 25 MG PO TABS: 25.0000 mg | ORAL_TABLET | Freq: Three times a day (TID) | ORAL | 0 refills | Status: DC | PRN

## 2018-03-12 MED ORDER — BETAMETHASONE SOD PHOS & ACET 6 (3-3) MG/ML IJ SUSP
12.0000 mg | INTRAMUSCULAR | Status: DC
  Administered 2018-03-12: 12 mg via INTRAMUSCULAR

## 2018-03-12 MED ORDER — MECLIZINE HCL 25 MG PO TABS
25.0000 mg | ORAL_TABLET | Freq: Three times a day (TID) | ORAL | Status: DC | PRN
  Administered 2018-03-12: 25 mg via ORAL
  Filled 2018-03-12 (×2): qty 1

## 2018-03-12 MED ORDER — ACETAMINOPHEN 500 MG PO TABS: 1000.0000 mg | ORAL_TABLET | Freq: Four times a day (QID) | ORAL | 0 refills | Status: DC | PRN

## 2018-03-12 MED ORDER — ACETAMINOPHEN 500 MG PO TABS
1000.0000 mg | ORAL_TABLET | Freq: Four times a day (QID) | ORAL | Status: DC | PRN
  Administered 2018-03-12: 1000 mg via ORAL
  Filled 2018-03-12: qty 2

## 2018-03-12 MED ORDER — BETAMETHASONE SOD PHOS & ACET 6 (3-3) MG/ML IJ SUSP
INTRAMUSCULAR | Status: AC
  Filled 2018-03-12: qty 5

## 2018-03-12 NOTE — Discharge Summary (Signed)
Physician Obstetric Discharge Summary  Patient ID: WARD AMMIRATI MRN: [id number] DOB/AGE: [birth year] 24 y.o.   Date of Admission: 03/12/2018  Date of Discharge:  03/12/18  Admitting Diagnosis: Observation at [redacted]w[redacted]d  Secondary Diagnosis: Anemia in pregnancy     Discharge Diagnosis: No other diagnosis   Antepartum Procedures: NST, Betamethasone x 1    Brief Hospital Course   This is a 23 y.o. G2P1 with IUP at [redacted]w[redacted]d observed with complaints of abdominal tenderness, nausea, vomiting, headache and increased vaginal discharge, noted to have a positive fetal fibronectin and cervical exam of 2/50/-3, flared.  No leaking of fluid and no bleeding.  She received betamethasone x 1 dose.  She was observed, fetal heart rate monitoring remained reassuring, and she had no signs/symptoms of progressing preterm labor or other maternal-fetal concerns.  Her cervical exam was unchanged from initial exam.  She was deemed stable for discharge to home with outpatient follow up.  Significant Diagnostic Studies:    Results for orders placed or performed during the hospital encounter of 03/12/18 (from the past 168 hour(s))  Wet prep, genital   Collection Time: 03/12/18  1:53 PM  Result Value Ref Range   Yeast Wet Prep HPF POC NONE SEEN NONE SEEN   Trich, Wet Prep NONE SEEN NONE SEEN   Clue Cells Wet Prep HPF POC NONE SEEN NONE SEEN   WBC, Wet Prep HPF POC FEW (A) NONE SEEN   Sperm NONE SEEN   Fetal fibronectin   Collection Time: 03/12/18  1:53 PM  Result Value Ref Range   Fetal Fibronectin POSITIVE (A) NEGATIVE   Appearance, FETFIB CLEAR CLEAR  Urinalysis, Complete w Microscopic   Collection Time: 03/12/18  3:51 PM  Result Value Ref Range   Color, Urine YELLOW (A) YELLOW   APPearance HAZY (A) CLEAR   Specific Gravity, Urine 1.016 1.005 - 1.030   pH 7.0 5.0 - 8.0   Glucose, UA NEGATIVE NEGATIVE mg/dL   Hgb urine dipstick NEGATIVE NEGATIVE   Bilirubin Urine NEGATIVE NEGATIVE   Ketones, ur 20  (A) NEGATIVE mg/dL   Protein, ur NEGATIVE NEGATIVE mg/dL   Nitrite NEGATIVE NEGATIVE   Leukocytes,Ua TRACE (A) NEGATIVE   RBC / HPF 6-10 0 - 5 RBC/hpf   WBC, UA 11-20 0 - 5 WBC/hpf   Bacteria, UA NONE SEEN NONE SEEN   Squamous Epithelial / LPF 21-50 0 - 5   Mucus PRESENT      Discharge Exam:  Temp:  [97.9 F (36.6 C)] 97.9 F (36.6 C) (02/19 1322) Pulse Rate:  [103] 103 (02/19 1322) Resp:  [18] 18 (02/19 1322) BP: (114)/(70) 114/70 (02/19 1322)  Physical Exam:  General appearance: alert and cooperative GI: soft, non-tender; bowel sounds normal; no masses,  no organomegaly and FHTs present  Dilation: 2(2cm external os, 1cm internal os) Effacement (%): 50 Station: -3 Presentation: Vertex Exam by:: Tracy Kinner, CNM'   Discharge Condition: Stable  Disposition: Discharge disposition: 01-Home or Self Care      Discharge Instructions    Discharge activity: Bedrest   Complete by:  As directed    With light activity only   Discharge diet:  No restrictions   Complete by:  As directed    Discharge instructions   Complete by:  As directed    See AVS   Do not have sex or do anything that might make you have an orgasm   Complete by:  As directed    Fetal Kick Count:  Lie on our left  side for one hour after a meal, and count the number of times your baby kicks.  If it is less than 5 times, get up, move around and drink some juice.  Repeat the test 30 minutes later.  If it is still less than 5 kicks in an hour, notify your doctor.   Complete by:  As directed    Notify physician for a general feeling that "something is not right"   Complete by:  As directed    Notify physician for increase or change in vaginal discharge   Complete by:  As directed    Notify physician for intestinal cramps, with or without diarrhea, sometimes described as "gas pain"   Complete by:  As directed    Notify physician for leaking of fluid   Complete by:  As directed    Notify physician for low, dull  backache, unrelieved by heat or Tylenol   Complete by:  As directed    Notify physician for menstrual like cramps   Complete by:  As directed    Notify physician for pelvic pressure   Complete by:  As directed    Notify physician for uterine contractions.  These may be painless and feel like the uterus is tightening or the baby is  "balling up"   Complete by:  As directed    Notify physician for vaginal bleeding   Complete by:  As directed    PRETERM LABOR:  Includes any of the follwing symptoms that occur between 20 - [redacted] weeks gestation.  If these symptoms are not stopped, preterm labor can result in preterm delivery, placing your baby at risk   Complete by:  As directed      Allergies as of 03/12/2018      Reactions   Nickel Rash      Medication List    TAKE these medications   acetaminophen 500 MG tablet Commonly known as:  TYLENOL Take 2 tablets (1,000 mg total) by mouth every 6 (six) hours as needed for fever or headache.   ferrous sulfate 325 (65 FE) MG tablet Take by mouth.   meclizine 25 MG tablet Commonly known as:  ANTIVERT Take 1 tablet (25 mg total) by mouth 3 (three) times daily as needed for dizziness or nausea.   PNV PRENATAL PLUS MULTIVITAMIN 27-1 MG Tabs Take by mouth.      Discharge Instructions: Per After Visit Summary.  Activity:  Also refer to After Visit Summary.  Diet: Regular  Outpatient follow up:  Follow-up Information    [facility]. Go in 1 day(s).   Why:  Please come to office tomorrow at 1430 for ROB and second dose of betamethasone Contact information: [address]  [address] [place] [address] [phone]          Gunnar Bulla, CNM [facility], [facility] 03/12/18 7:16 PM

## 2018-03-12 NOTE — Discharge Instructions (Signed)
Preventing Preterm Birth  Preterm birth is when your baby is delivered between 20 weeks and 37 weeks of pregnancy. A full-term pregnancy lasts for at least 37 weeks. Preterm birth can be dangerous for your baby because the last few weeks of pregnancy are an important time for your baby's brain and lungs to grow. Many things can cause a baby to be born early. Sometimes the cause is not known. There are certain factors that make you more likely to experience preterm birth, such as:  · Having a previous baby born preterm.  · Being pregnant with twins or other multiples.  · Having had fertility treatment.  · Being overweight or underweight at the start of your pregnancy.  · Having any of the following during pregnancy:  ? An infection, including a urinary tract infection (UTI) or an STI (sexually transmitted infection).  ? High blood pressure.  ? Diabetes.  ? Vaginal bleeding.  · Being age 35 or older.  · Being age 18 or younger.  · Getting pregnant within 6 months of a previous pregnancy.  · Suffering extreme stress or physical or emotional abuse during pregnancy.  · Standing for long periods of time during pregnancy, such as working at a job that requires standing.  What are the risks?  The most serious risk of preterm birth is that the baby may not survive. This is more likely to happen if a baby is born before 34 weeks. Other risks and complications of preterm birth may include your baby having:  · Breathing problems.  · Brain damage that affects movement and coordination (cerebral palsy).  · Feeding difficulties.  · Vision or hearing problems.  · Infections or inflammation of the digestive tract (colitis).  · Developmental delays.  · Learning disabilities.  · Higher risk for diabetes, heart disease, and high blood pressure later in life.  What can I do to lower my risk?    Medical care  The most important thing you can do to lower your risk for preterm birth is to get routine medical care during pregnancy (prenatal  care). If you have a high risk of preterm birth, you may be referred to a health care provider who specializes in managing high-risk pregnancies (perinatologist). You may be given medicine to help prevent preterm birth.  Lifestyle changes  Certain lifestyle changes can also lower your risk of preterm birth:  · Wait at least 6 months after a pregnancy to become pregnant again.  · Try to plan pregnancy for when you are between 19 and 35 years old.  · Get to a healthy weight before getting pregnant. If you are overweight, work with your health care provider to safely lose weight.  · Do not use any products that contain nicotine or tobacco, such as cigarettes and e-cigarettes. If you need help quitting, ask your health care provider.  · Do not drink alcohol.  · Do not use drugs.  Where to find support  For more support, consider:  · Talking with your health care provider.  · Talking with a therapist or substance abuse counselor, if you need help quitting.  · Working with a diet and nutrition specialist (dietitian) or a personal trainer to maintain a healthy weight.  · Joining a support group.  Where to find more information  Learn more about preventing preterm birth from:  · Centers for Disease Control and Prevention: cdc.gov/reproductivehealth/maternalinfanthealth/pretermbirth.htm  · March of Dimes: marchofdimes.org/complications/premature-babies.aspx  · American Pregnancy Association: americanpregnancy.org/labor-and-birth/premature-labor  Contact a health care   Regular tightening (contractions) in your lower abdomen. Summary  Preterm birth means having your baby during weeks 20-37 of pregnancy.  Preterm birth may put your baby at risk for physical and  mental problems.  Getting good prenatal care can help prevent preterm birth.  You can lower your risk of preterm birth by making certain lifestyle changes, such as not smoking and not using alcohol. This information is not intended to replace advice given to you by your health care provider. Make sure you discuss any questions you have with your health care provider. Document Released: 02/22/2015 Document Revised: 09/17/2015 Document Reviewed: 09/17/2015 Elsevier Interactive Patient Education  2019 [employer]. Preterm Labor and Birth Information Pregnancy normally lasts 39-41 weeks. Preterm labor is when labor starts early. It starts before you have been pregnant for 37 whole weeks. What are the risk factors for preterm labor? Preterm labor is more likely to occur in women who:  Have an infection while pregnant.  Have a cervix that is short.  Have gone into preterm labor before.  Have had surgery on their cervix.  Are younger than age 13.  Are older than age 21.  Are African American.  Are pregnant with two or more babies.  Take street drugs while pregnant.  Smoke while pregnant.  Do not gain enough weight while pregnant.  Got pregnant right after another pregnancy. What are the symptoms of preterm labor? Symptoms of preterm labor include:  Cramps. The cramps may feel like the cramps some women get during their period. The cramps may happen with watery poop (diarrhea).  Pain in the belly (abdomen).  Pain in the lower back.  Regular contractions or tightening. It may feel like your belly is getting tighter.  Pressure in the lower belly that seems to get stronger.  More fluid (discharge) leaking from the vagina. The fluid may be watery or bloody.  Water breaking. Why is it important to notice signs of preterm labor? Babies who are born early may not be fully developed. They have a higher chance for:  Long-term heart problems.  Long-term lung  problems.  Trouble controlling body systems, like breathing.  Bleeding in the brain.  A condition called cerebral palsy.  Learning difficulties.  Death. These risks are highest for babies who are born before 34 weeks of pregnancy. How is preterm labor treated? Treatment depends on:  How long you were pregnant.  Your condition.  The health of your baby. Treatment may involve:  Having a stitch (suture) placed in your cervix. When you give birth, your cervix opens so the baby can come out. The stitch keeps the cervix from opening too soon.  Staying at the hospital.  Taking or getting medicines, such as: ? Hormone medicines. ? Medicines to stop contractions. ? Medicines to help the babys lungs develop. ? Medicines to prevent your baby from having cerebral palsy. What should I do if I am in preterm labor? If you think you are going into labor too soon, call your doctor right away. How can I prevent preterm labor?  Do not use any tobacco products. ? Examples of these are cigarettes, chewing tobacco, and e-cigarettes. ? If you need help quitting, ask your doctor.  Do not use street drugs.  Do not use any medicines unless you ask your doctor if they are safe for you.  Talk with your doctor before taking any herbal supplements.  Make sure you gain enough weight.  Watch for infection. If you think you might have an  infection, get it checked right away. °· If you have gone into preterm labor before, tell your doctor. °This information is not intended to replace advice given to you by your health care provider. Make sure you discuss any questions you have with your health care provider. °Document Released: 04/06/2008 Document Revised: 06/21/2015 Document Reviewed: 06/01/2015 °Elsevier Interactive Patient Education © 2019 Elsevier Inc. ° °

## 2018-03-12 NOTE — OB Triage Note (Addendum)
Pt c/o dizziness, nausea, and headache since Saturday. Pt states she is on her feet 8 hrs a day at work, her belly feels like its being pulled downward and is very tender

## 2018-03-13 ENCOUNTER — Ambulatory Visit (INDEPENDENT_AMBULATORY_CARE_PROVIDER_SITE_OTHER): Payer: Medicaid Other | Admitting: Certified Nurse Midwife

## 2018-03-13 VITALS — BP 110/72 | HR 111 | Wt 205.3 lb

## 2018-03-13 DIAGNOSIS — Z3A34 34 weeks gestation of pregnancy: Secondary | ICD-10-CM | POA: Diagnosis not present

## 2018-03-13 DIAGNOSIS — Z3493 Encounter for supervision of normal pregnancy, unspecified, third trimester: Secondary | ICD-10-CM

## 2018-03-13 MED ORDER — BETAMETHASONE SOD PHOS & ACET 6 (3-3) MG/ML IJ SUSP: 12.0000 mg | Freq: Once | INTRAMUSCULAR | Status: DC

## 2018-03-13 NOTE — Patient Instructions (Signed)
Preterm Labor and Birth Information °Pregnancy normally lasts 39-41 weeks. Preterm labor is when labor starts early. It starts before you have been pregnant for 37 whole weeks. °What are the risk factors for preterm labor? °Preterm labor is more likely to occur in women who: °· Have an infection while pregnant. °· Have a cervix that is short. °· Have gone into preterm labor before. °· Have had surgery on their cervix. °· Are younger than age 24. °· Are older than age 35. °· Are African American. °· Are pregnant with two or more babies. °· Take street drugs while pregnant. °· Smoke while pregnant. °· Do not gain enough weight while pregnant. °· Got pregnant right after another pregnancy. °What are the symptoms of preterm labor? °Symptoms of preterm labor include: °· Cramps. The cramps may feel like the cramps some women get during their period. The cramps may happen with watery poop (diarrhea). °· Pain in the belly (abdomen). °· Pain in the lower back. °· Regular contractions or tightening. It may feel like your belly is getting tighter. °· Pressure in the lower belly that seems to get stronger. °· More fluid (discharge) leaking from the vagina. The fluid may be watery or bloody. °· Water breaking. °Why is it important to notice signs of preterm labor? °Babies who are born early may not be fully developed. They have a higher chance for: °· Long-term heart problems. °· Long-term lung problems. °· Trouble controlling body systems, like breathing. °· Bleeding in the brain. °· A condition called cerebral palsy. °· Learning difficulties. °· Death. °These risks are highest for babies who are born before 34 weeks of pregnancy. °How is preterm labor treated? °Treatment depends on: °· How long you were pregnant. °· Your condition. °· The health of your baby. °Treatment may involve: °· Having a stitch (suture) placed in your cervix. When you give birth, your cervix opens so the baby can come out. The stitch keeps the cervix  from opening too soon. °· Staying at the hospital. °· Taking or getting medicines, such as: °? Hormone medicines. °? Medicines to stop contractions. °? Medicines to help the baby’s lungs develop. °? Medicines to prevent your baby from having cerebral palsy. °What should I do if I am in preterm labor? °If you think you are going into labor too soon, call your doctor right away. °How can I prevent preterm labor? °· Do not use any tobacco products. °? Examples of these are cigarettes, chewing tobacco, and e-cigarettes. °? If you need help quitting, ask your doctor. °· Do not use street drugs. °· Do not use any medicines unless you ask your doctor if they are safe for you. °· Talk with your doctor before taking any herbal supplements. °· Make sure you gain enough weight. °· Watch for infection. If you think you might have an infection, get it checked right away. °· If you have gone into preterm labor before, tell your doctor. °This information is not intended to replace advice given to you by your health care provider. Make sure you discuss any questions you have with your health care provider. °Document Released: 04/06/2008 Document Revised: 06/21/2015 Document Reviewed: 06/01/2015 °Elsevier Interactive Patient Education © 2019 Elsevier Inc. ° °

## 2018-03-13 NOTE — Progress Notes (Signed)
ROB-Seen yesterday in OB triage with complaints of nausea, vomiting, and dizziness. FFN positive. Second dose of betamethasone given today. Bedrest with light activity until 37 wks, paperwork complete. Anticipatory guidance regarding course of prenatal care. Reviewed red flag symptoms and when to call. Requests to return in 2 weeks for growth ultrasound, cultures and ROB.

## 2018-03-14 ENCOUNTER — Encounter: Payer: Medicaid Other | Admitting: Certified Nurse Midwife

## 2018-03-26 ENCOUNTER — Encounter: Payer: Medicaid Other | Admitting: Certified Nurse Midwife

## 2018-03-27 ENCOUNTER — Other Ambulatory Visit: Payer: Self-pay

## 2018-03-27 DIAGNOSIS — Z3493 Encounter for supervision of normal pregnancy, unspecified, third trimester: Secondary | ICD-10-CM

## 2018-03-28 ENCOUNTER — Ambulatory Visit (INDEPENDENT_AMBULATORY_CARE_PROVIDER_SITE_OTHER): Payer: Medicaid Other | Admitting: Certified Nurse Midwife

## 2018-03-28 ENCOUNTER — Encounter: Payer: Self-pay | Admitting: Certified Nurse Midwife

## 2018-03-28 VITALS — BP 115/58 | HR 100 | Wt 212.0 lb

## 2018-03-28 DIAGNOSIS — Z3483 Encounter for supervision of other normal pregnancy, third trimester: Secondary | ICD-10-CM | POA: Diagnosis not present

## 2018-03-28 LAB — POCT URINALYSIS DIPSTICK OB
Bilirubin, UA: NEGATIVE
Blood, UA: NEGATIVE
Glucose, UA: NEGATIVE
Ketones, UA: NEGATIVE
Leukocytes, UA: NEGATIVE
Nitrite, UA: NEGATIVE
POC,PROTEIN,UA: NEGATIVE
Spec Grav, UA: 1.015
Urobilinogen, UA: 0.2 U/dL
pH, UA: 6.5

## 2018-03-28 NOTE — Progress Notes (Signed)
ROB doing well. Feels good movement. GBS and cultures today. SVE per pt request. 1/50/-3. Follow up 1 wk.   Doreene Burke, CNM

## 2018-03-28 NOTE — Patient Instructions (Signed)
Group B Streptococcus Infection During Pregnancy  Group B Streptococcus (GBS) is a type of bacteria (Streptococcus agalactiae) that is often found in healthy people, commonly in the rectum, vagina, and intestines. In people who are healthy and not pregnant, the bacteria rarely cause serious illness or complications. However, women who test positive for GBS during pregnancy can pass the bacteria to their baby during childbirth, which can cause serious infection in the baby after birth. Women with GBS may also have infections during their pregnancy or immediately after childbirth, such as such as urinary tract infections (UTIs) or infections of the uterus (uterine infections). Having GBS also increases a woman's risk of complications during pregnancy, such as early (preterm) labor or delivery, miscarriage, or stillbirth. Routine testing (screening) for GBS is recommended for all pregnant women. What increases the risk? You may have a higher risk for GBS infection during pregnancy if you had one during a past pregnancy. What are the signs or symptoms? In most cases, GBS infection does not cause symptoms in pregnant women. Signs and symptoms of a possible GBS-related infection may include:  Labor starting before the 37th week of pregnancy.  A UTI or bladder infection, which may cause: ? Fever. ? Pain or burning during urination. ? Frequent urination.  Fever during labor, along with: ? Bad-smelling discharge. ? Uterine tenderness. ? Rapid heartbeat in the mother, baby, or both. Rare but serious symptoms of a possible GBS-related infection in women include:  Blood infection (septicemia). This may cause fever, chills, or confusion.  Lung infection (pneumonia). This may cause fever, chills, cough, rapid breathing, difficulty breathing, or chest pain.  Bone, joint, skin, or soft tissue infection. How is this diagnosed? You may be screened for GBS between week 35 and week 37 of your pregnancy. If  you have symptoms of preterm labor, you may be screened earlier. This condition is diagnosed based on lab test results from:  A swab of fluid from the vagina and rectum.  A urine sample. How is this treated? This condition is treated with antibiotic medicine. When you go into labor, or as soon as your water breaks (your membranes rupture), you will be given antibiotics through an IV tube. Antibiotics will continue until after you give birth. If you are having a cesarean delivery, you do not need antibiotics unless your membranes have already ruptured. Follow these instructions at home:  Take over-the-counter and prescription medicines only as told by your health care provider.  Take your antibiotic medicine as told by your health care provider. Do not stop taking the antibiotic even if you start to feel better.  Keep all pre-birth (prenatal) visits and follow-up visits as told by your health care provider. This is important. Contact a health care provider if:  You have pain or burning when you urinate.  You have to urinate frequently.  You have a fever or chills.  You develop a bad-smelling vaginal discharge. Get help right away if:  Your membranes rupture.  You go into labor.  You have severe pain in your abdomen.  You have difficulty breathing.  You have chest pain. This information is not intended to replace advice given to you by your health care provider. Make sure you discuss any questions you have with your health care provider. Document Released: 04/17/2007 Document Revised: 08/05/2015 Document Reviewed: 08/04/2015 Elsevier Interactive Patient Education  2019 Elsevier Inc.  

## 2018-03-30 LAB — GC/CHLAMYDIA PROBE AMP
Chlamydia trachomatis, NAA: NEGATIVE
Neisseria gonorrhoeae by PCR: NEGATIVE

## 2018-03-30 LAB — STREP GP B NAA: Strep Gp B NAA: POSITIVE — AB

## 2018-04-01 ENCOUNTER — Other Ambulatory Visit: Payer: Medicaid Other

## 2018-04-02 ENCOUNTER — Ambulatory Visit: Admission: RE | Admit: 2018-04-02 | Payer: Medicaid Other | Source: Ambulatory Visit

## 2018-04-03 ENCOUNTER — Telehealth: Payer: Self-pay | Admitting: Certified Nurse Midwife

## 2018-04-03 NOTE — Telephone Encounter (Signed)
Telephone call from patient. Verified full name and date of birth.   Calling to report loss of mucus plug over the last week with intermittent pelvic pain and back pain.   Good fetal movement. No contractions or vaginal bleeding.   Advised place a dry pad and monitor for the next one (1) to two (2) hour. If that pad becomes moist or starts to fill with fluid.   Reviewed red flag symptoms and when to call. RTC as previously scheduled.    Gunnar Bulla, CNM [facility], [facility] 04/03/18 3:54 PM

## 2018-04-04 ENCOUNTER — Ambulatory Visit (INDEPENDENT_AMBULATORY_CARE_PROVIDER_SITE_OTHER): Payer: Medicaid Other | Admitting: Obstetrics and Gynecology

## 2018-04-04 ENCOUNTER — Other Ambulatory Visit: Payer: Self-pay

## 2018-04-04 VITALS — BP 120/60 | HR 101 | Wt 214.0 lb

## 2018-04-04 DIAGNOSIS — Z3493 Encounter for supervision of normal pregnancy, unspecified, third trimester: Secondary | ICD-10-CM

## 2018-04-04 DIAGNOSIS — O99013 Anemia complicating pregnancy, third trimester: Secondary | ICD-10-CM

## 2018-04-04 DIAGNOSIS — O99019 Anemia complicating pregnancy, unspecified trimester: Secondary | ICD-10-CM | POA: Diagnosis not present

## 2018-04-04 LAB — POCT URINALYSIS DIPSTICK OB
Bilirubin, UA: NEGATIVE
Blood, UA: NEGATIVE
Glucose, UA: NEGATIVE
Ketones, UA: NEGATIVE
NITRITE UA: NEGATIVE
Spec Grav, UA: 1.01 (ref 1.010–1.025)
Urobilinogen, UA: 0.2 E.U./dL
pH, UA: 7.5 (ref 5.0–8.0)

## 2018-04-04 NOTE — Progress Notes (Signed)
ROB- pt is having some pelvic pressure 

## 2018-04-04 NOTE — Progress Notes (Signed)
ROB- labor precautions discussed. Anemia labs repeated.declined cervical exam. Discussed visitor restrictions at [facility], plans nexplanon PP.

## 2018-04-05 LAB — CBC
Hematocrit: 26.4 % — ABNORMAL LOW (ref 34.0–46.6)
Hemoglobin: 8.8 g/dL — ABNORMAL LOW (ref 11.1–15.9)
MCH: 28.5 pg (ref 26.6–33.0)
MCHC: 33.3 g/dL (ref 31.5–35.7)
MCV: 85 fL (ref 79–97)
Platelets: 240 10*3/uL (ref 150–450)
RBC: 3.09 x10E6/uL — AB (ref 3.77–5.28)
RDW: 13.4 % (ref 11.7–15.4)
WBC: 9 10*3/uL (ref 3.4–10.8)

## 2018-04-05 LAB — B12 AND FOLATE PANEL
Folate: 11.9 ng/mL (ref >3.0)
Vitamin B-12: 198 pg/mL — ABNORMAL LOW (ref 232–1245)

## 2018-04-05 LAB — VITAMIN D 25 HYDROXY (VIT D DEFICIENCY, FRACTURES): Vit D, 25-Hydroxy: 9.6 ng/mL — ABNORMAL LOW (ref 30.0–100.0)

## 2018-04-05 LAB — FERRITIN: Ferritin: 6 ng/mL — ABNORMAL LOW (ref 15–150)

## 2018-04-07 ENCOUNTER — Other Ambulatory Visit: Payer: Self-pay

## 2018-04-07 ENCOUNTER — Inpatient Hospital Stay
Admission: EM | Admit: 2018-04-07 | Discharge: 2018-04-09 | DRG: 806 | Disposition: A | Discharge status: Home / Self Care | Payer: Medicaid Other | Attending: Certified Nurse Midwife | Admitting: Certified Nurse Midwife

## 2018-04-07 ENCOUNTER — Inpatient Hospital Stay: Payer: Medicaid Other | Admitting: Anesthesiology

## 2018-04-07 ENCOUNTER — Telehealth: Payer: Self-pay

## 2018-04-07 DIAGNOSIS — O9902 Anemia complicating childbirth: Secondary | ICD-10-CM | POA: Diagnosis not present

## 2018-04-07 DIAGNOSIS — Z3A37 37 weeks gestation of pregnancy: Secondary | ICD-10-CM | POA: Diagnosis not present

## 2018-04-07 DIAGNOSIS — O99824 Streptococcus B carrier state complicating childbirth: Secondary | ICD-10-CM | POA: Diagnosis not present

## 2018-04-07 DIAGNOSIS — O26893 Other specified pregnancy related conditions, third trimester: Secondary | ICD-10-CM | POA: Diagnosis present

## 2018-04-07 DIAGNOSIS — O4202 Full-term premature rupture of membranes, onset of labor within 24 hours of rupture: Secondary | ICD-10-CM | POA: Diagnosis not present

## 2018-04-07 DIAGNOSIS — O99820 Streptococcus B carrier state complicating pregnancy: Secondary | ICD-10-CM

## 2018-04-07 DIAGNOSIS — D649 Anemia, unspecified: Secondary | ICD-10-CM | POA: Diagnosis present

## 2018-04-07 DIAGNOSIS — O4292 Full-term premature rupture of membranes, unspecified as to length of time between rupture and onset of labor: Secondary | ICD-10-CM | POA: Diagnosis not present

## 2018-04-07 DIAGNOSIS — Z87891 Personal history of nicotine dependence: Secondary | ICD-10-CM

## 2018-04-07 LAB — URINE DRUG SCREEN, QUALITATIVE (ARMC ONLY)
Amphetamines, Ur Screen: NOT DETECTED
BENZODIAZEPINE, UR SCRN: NOT DETECTED
Barbiturates, Ur Screen: NOT DETECTED
Cannabinoid 50 Ng, Ur ~~LOC~~: POSITIVE — AB
Cocaine Metabolite,Ur ~~LOC~~: NOT DETECTED
MDMA (Ecstasy)Ur Screen: NOT DETECTED
Methadone Scn, Ur: NOT DETECTED
Opiate, Ur Screen: NOT DETECTED
Phencyclidine (PCP) Ur S: NOT DETECTED
Tricyclic, Ur Screen: NOT DETECTED

## 2018-04-07 LAB — CBC
HCT: 27.6 % — ABNORMAL LOW (ref 36.0–46.0)
Hemoglobin: 9 g/dL — ABNORMAL LOW (ref 12.0–15.0)
MCH: 29.3 pg (ref 26.0–34.0)
MCHC: 32.6 g/dL (ref 30.0–36.0)
MCV: 89.9 fL (ref 80.0–100.0)
PLATELETS: 227 10*3/uL (ref 150–400)
RBC: 3.07 MIL/uL — ABNORMAL LOW (ref 3.87–5.11)
RDW: 14.2 % (ref 11.5–15.5)
WBC: 10.6 10*3/uL — AB (ref 4.0–10.5)
nRBC: 0 % (ref 0.0–0.2)

## 2018-04-07 LAB — RUPTURE OF MEMBRANE (ROM)PLUS: Rom Plus: POSITIVE

## 2018-04-07 LAB — TYPE AND SCREEN
ABO/RH(D): O POS
Antibody Screen: NEGATIVE

## 2018-04-07 MED ORDER — OXYTOCIN 40 UNITS IN NORMAL SALINE INFUSION - SIMPLE MED
1.0000 m[IU]/min | INTRAVENOUS | Status: DC
  Administered 2018-04-07: 2 m[IU]/min via INTRAVENOUS
  Filled 2018-04-07: qty 1000

## 2018-04-07 MED ORDER — ONDANSETRON HCL 4 MG/2ML IJ SOLN
4.0000 mg | Freq: Four times a day (QID) | INTRAMUSCULAR | Status: DC | PRN
  Administered 2018-04-07 (×2): 4 mg via INTRAVENOUS
  Filled 2018-04-07 (×2): qty 2

## 2018-04-07 MED ORDER — OXYTOCIN 10 UNIT/ML IJ SOLN
INTRAMUSCULAR | Status: AC
  Filled 2018-04-07: qty 2

## 2018-04-07 MED ORDER — OXYTOCIN 40 UNITS IN NORMAL SALINE INFUSION - SIMPLE MED
2.5000 [IU]/h | INTRAVENOUS | Status: DC
  Filled 2018-04-07: qty 1000

## 2018-04-07 MED ORDER — MISOPROSTOL 100 MCG PO TABS
50.0000 ug | ORAL_TABLET | ORAL | Status: DC
  Filled 2018-04-07 (×2): qty 1

## 2018-04-07 MED ORDER — PHENYLEPHRINE 40 MCG/ML (10ML) SYRINGE FOR IV PUSH (FOR BLOOD PRESSURE SUPPORT)
80.0000 ug | PREFILLED_SYRINGE | INTRAVENOUS | Status: DC | PRN
  Filled 2018-04-07: qty 10

## 2018-04-07 MED ORDER — LIDOCAINE HCL (PF) 1 % IJ SOLN
INTRAMUSCULAR | Status: DC | PRN
  Administered 2018-04-07: 3 mL via SUBCUTANEOUS

## 2018-04-07 MED ORDER — EPHEDRINE 5 MG/ML INJ
10.0000 mg | INTRAVENOUS | Status: DC | PRN
  Filled 2018-04-07: qty 2

## 2018-04-07 MED ORDER — LIDOCAINE HCL (PF) 1 % IJ SOLN
30.0000 mL | INTRAMUSCULAR | Status: DC | PRN
  Filled 2018-04-07: qty 30

## 2018-04-07 MED ORDER — LACTATED RINGERS IV SOLN
INTRAVENOUS | Status: DC
  Administered 2018-04-07: 12:00:00 via INTRAVENOUS

## 2018-04-07 MED ORDER — OXYCODONE-ACETAMINOPHEN 5-325 MG PO TABS: 1.0000 | ORAL_TABLET | ORAL | Status: DC | PRN

## 2018-04-07 MED ORDER — BUPIVACAINE HCL (PF) 0.25 % IJ SOLN
INTRAMUSCULAR | Status: DC | PRN
  Administered 2018-04-07: 5 mL via EPIDURAL
  Administered 2018-04-07: 3 mL via EPIDURAL

## 2018-04-07 MED ORDER — AMMONIA AROMATIC IN INHA
RESPIRATORY_TRACT | Status: AC
  Filled 2018-04-07: qty 10

## 2018-04-07 MED ORDER — SODIUM CHLORIDE 0.9 % IV SOLN
5.0000 10*6.[IU] | Freq: Once | INTRAVENOUS | Status: AC
  Administered 2018-04-07: 5 10*6.[IU] via INTRAVENOUS
  Filled 2018-04-07: qty 5

## 2018-04-07 MED ORDER — ACETAMINOPHEN 325 MG PO TABS
650.0000 mg | ORAL_TABLET | ORAL | Status: DC | PRN
  Administered 2018-04-08: 650 mg via ORAL
  Filled 2018-04-07: qty 2

## 2018-04-07 MED ORDER — FENTANYL 2.5 MCG/ML W/ROPIVACAINE 0.15% IN NS 100 ML EPIDURAL (ARMC)
EPIDURAL | Status: DC | PRN
  Administered 2018-04-07: 12 mL/h via EPIDURAL

## 2018-04-07 MED ORDER — OXYCODONE-ACETAMINOPHEN 5-325 MG PO TABS: 2.0000 | ORAL_TABLET | ORAL | Status: DC | PRN

## 2018-04-07 MED ORDER — DIPHENHYDRAMINE HCL 50 MG/ML IJ SOLN
12.5000 mg | INTRAMUSCULAR | Status: DC | PRN
  Administered 2018-04-07: 12.5 mg via INTRAVENOUS
  Filled 2018-04-07: qty 1

## 2018-04-07 MED ORDER — LACTATED RINGERS IV SOLN: 500.0000 mL | Freq: Once | INTRAVENOUS | Status: DC

## 2018-04-07 MED ORDER — TERBUTALINE SULFATE 1 MG/ML IJ SOLN: 0.2500 mg | Freq: Once | INTRAMUSCULAR | Status: DC | PRN

## 2018-04-07 MED ORDER — FENTANYL 2.5 MCG/ML W/ROPIVACAINE 0.15% IN NS 100 ML EPIDURAL (ARMC)
12.0000 mL/h | EPIDURAL | Status: DC
  Filled 2018-04-07: qty 100

## 2018-04-07 MED ORDER — OXYTOCIN BOLUS FROM INFUSION
500.0000 mL | Freq: Once | INTRAVENOUS | Status: AC
  Administered 2018-04-08: 500 mL via INTRAVENOUS

## 2018-04-07 MED ORDER — BUTORPHANOL TARTRATE 1 MG/ML IJ SOLN: 1.0000 mg | INTRAMUSCULAR | Status: DC | PRN

## 2018-04-07 MED ORDER — FENTANYL 2.5 MCG/ML W/ROPIVACAINE 0.15% IN NS 100 ML EPIDURAL (ARMC)
EPIDURAL | Status: AC
  Filled 2018-04-07: qty 100

## 2018-04-07 MED ORDER — LACTATED RINGERS IV SOLN: 500.0000 mL | INTRAVENOUS | Status: DC | PRN

## 2018-04-07 MED ORDER — PENICILLIN G 3 MILLION UNITS IVPB - SIMPLE MED
3.0000 10*6.[IU] | INTRAVENOUS | Status: DC
  Administered 2018-04-07 – 2018-04-08 (×3): 3 10*6.[IU] via INTRAVENOUS
  Filled 2018-04-07 (×2): qty 100
  Filled 2018-04-07 (×2): qty 3
  Filled 2018-04-07 (×7): qty 100

## 2018-04-07 MED ORDER — ZOLPIDEM TARTRATE 5 MG PO TABS: 5.0000 mg | ORAL_TABLET | Freq: Every evening | ORAL | Status: DC | PRN

## 2018-04-07 MED ORDER — MISOPROSTOL 200 MCG PO TABS
ORAL_TABLET | ORAL | Status: AC
  Administered 2018-04-08: 800 ug
  Filled 2018-04-07: qty 4

## 2018-04-07 MED ORDER — SOD CITRATE-CITRIC ACID 500-334 MG/5ML PO SOLN: 30.0000 mL | ORAL | Status: DC | PRN

## 2018-04-07 MED ORDER — LIDOCAINE-EPINEPHRINE (PF) 1.5 %-1:200000 IJ SOLN
INTRAMUSCULAR | Status: DC | PRN
  Administered 2018-04-07: 3 mL via EPIDURAL

## 2018-04-07 NOTE — Progress Notes (Signed)
Patient ID: Christy Schmidt, female   DOB: [birth year], 23 y.o.   MRN: [id number]  Christy Schmidt is a 23 y.o. G2P1001 at [redacted]w[redacted]d by LMP admitted for full term premature rupture of membranes  Subjective:  Patient resting quietly in bed, watching television. Reports leakage of fluid with contractions.   Denies difficulty breathing or respiratory distress, chest pain, abdominal pain, and leg pain or swelling.   FOB at bedside for continuous labor support.   Objective:  Temp:  [97.8 F (36.6 C)-98.4 F (36.9 C)] 97.8 F (36.6 C) (03/16 2155) Pulse Rate:  [82-121] 94 (03/16 2155) Resp:  [18-19] 18 (03/16 1910) BP: (96-129)/(42-88) 112/60 (03/16 2155) SpO2:  [95 %-100 %] 100 % (03/16 2155) Weight:  [97.1 kg] 97.1 kg (03/16 0921)  Fetal Wellbeing:  Category I  UC:   regular, every one (1) to three (3) minutes; soft resting tone; pitocin infusing at 32 mu/min  SVE:   Dilation: 8.5 Effacement (%): 80 Station: -2 Exam by:: Christy Schmidt, CNM  Labs: Lab Results  Component Value Date   WBC 10.6 (H) 04/07/2018   HGB 9.0 (L) 04/07/2018   HCT 27.6 (L) 04/07/2018   MCV 89.9 04/07/2018   PLT 227 04/07/2018    Assessment:  Christy Schmidt a 23 y.o.G2P0 at [redacted]w[redacted]d admitted for augmentation due to full term premature rupture of membranes, Late entry prenatal care, Anemia in pregnancy, History of shoulder dystocia, History of immediate postpartum hemorrhage requiring blood transfusion, History of marijuana use, Group Beta Strep Positive, Pitocin augmentation  FHR Category I  Plan:  Discussed actual vs expected progress of labor with patient and significant other, verbalized understanding.   IUPC placed without difficulty, will continue to titrate pitocin at this time.   Encouraged position change and use of peanut ball.   Reviewed red flag symptoms and when to call.   Continue orders as written. Reassess as needed.    Gunnar Bulla, CNM [facility],  [facility] 04/07/2018, 9:57 PM

## 2018-04-07 NOTE — Anesthesia Procedure Notes (Signed)
Epidural  Start time: 04/07/2018 5:29 PM End time: 04/07/2018 5:45 PM  Staffing Anesthesiologist: Piscitello, Cleda Mccreedy, MD Resident/CRNA: Irving Burton, CRNA Performed: resident/CRNA   Preanesthetic Checklist Completed: patient identified, site marked, surgical consent, pre-op evaluation, IV checked, risks and benefits discussed and monitors and equipment checked  Epidural Patient position: sitting Prep: ChloraPrep and site prepped and draped Patient monitoring: continuous pulse ox, blood pressure and heart rate Approach: midline Location: L3-L4 Injection technique: LOR air  Needle:  Needle type: Tuohy  Needle gauge: 17 G Needle length: 9 cm Needle insertion depth: 8 cm Catheter type: closed end flexible Catheter size: 19 Gauge Catheter at skin depth: 13 cm Test dose: negative and 1.5% lidocaine with Epi 1:200 K  Assessment Events: blood not aspirated, injection not painful, no injection resistance, negative IV test and no paresthesia  Additional Notes Reason for block:procedure for pain

## 2018-04-07 NOTE — OB Triage Note (Signed)
Patient presented to L&D with complaints of leaking clear fluid since yesterday around 2:00 am, noticed some pink when she wipes but no bright red bleeding, denies decreased fetal movements.

## 2018-04-07 NOTE — Telephone Encounter (Signed)
Pt called stating that she think her water may have broken for 24 hours now. Pt stated that she is having some leaking but it's not urine. Pt was advised to go to the L&D to be checked. Pt wanted to speak to one of the midwives and was informed that none of them were available.

## 2018-04-07 NOTE — Anesthesia Preprocedure Evaluation (Signed)
Anesthesia Evaluation  Patient identified by MRN, date of birth, ID band Patient awake    Reviewed: Allergy & Precautions, H&P , NPO status , Patient's Chart, lab work & pertinent test results  History of Anesthesia Complications Negative for: history of anesthetic complications  Airway Mallampati: II       Dental no notable dental hx. (+) Teeth Intact   Pulmonary former smoker,           Cardiovascular      Neuro/Psych  Headaches,    GI/Hepatic   Endo/Other    Renal/GU      Musculoskeletal   Abdominal   Peds  Hematology  (+) Blood dyscrasia, anemia ,   Anesthesia Other Findings   Reproductive/Obstetrics (+) Pregnancy                             Anesthesia Physical Anesthesia Plan  ASA: II  Anesthesia Plan: Epidural   Post-op Pain Management:    Induction:   PONV Risk Score and Plan:   Airway Management Planned:   Additional Equipment:   Intra-op Plan:   Post-operative Plan:   Informed Consent: I have reviewed the patients History and Physical, chart, labs and discussed the procedure including the risks, benefits and alternatives for the proposed anesthesia with the patient or authorized representative who has indicated his/her understanding and acceptance.       Plan Discussed with: Anesthesiologist  Anesthesia Plan Comments:         Anesthesia Quick Evaluation

## 2018-04-07 NOTE — H&P (Signed)
Obstetric History and Physical  Christy Schmidt is a 23 y.o. G2P0 with IUP at [redacted]w[redacted]d presenting with leakage of clear fluid since yesterday morning at approximately 0200.   Patient states she has been having  none contractions, none vaginal bleeding, ruptured, clear fluid membranes, with active fetal movement.    Denies difficulty breathing or respiratory distress, chest pain, abdominal pain, dysuria, and leg pain or swelling.   Prenatal Course  Source of Care: [facility]-transfer of care from [facility] at 25 wks, initial [facility] visit at 18 wks, total visits at [facility]  Pregnancy complications or risks: Late entry prenatal care, Anemia in pregnancy, History of shoulder dystocia, History of immediate postpartum hemorrhage requiring blood transfusion, History of marijuana use, Group Beta Strep Positive  Prenatal labs and studies:  ABO, Rh: O positive (11/19/2017)  Antibody: Negative (11/19/2017)  Rubella: Immune (09/12/2014)  RPR: Non Reactive (01/09 0948)   HBsAg: Negative (11/19/2017)   HIV: Negative (11/19/2017)  [id number] (03/06 1307)  1 hr Glucola: 128 (01/30/2018 0948)  Genetic screening: Quad screen negative (11/19/2017)  Anatomy US: Complete, normal (11/22/2017)  Past Medical History:  Diagnosis Date  . SVXBLTJQ(300.9)     Past Surgical History:  Procedure Laterality Date  . OTHER SURGICAL HISTORY Right 2010   Bone Removed on Right Hand 3rd Digit    OB History  Gravida Para Term Preterm AB Living  2            SAB TAB Ectopic Multiple Live Births               # Outcome Date GA Lbr Len/2nd Weight Sex Delivery Anes PTL Lv  2 Current           1 Gravida             Social History   Socioeconomic History  . Marital status: Divorced    Spouse name: Not on file  . Number of children: Not on file  . Years of education: Not on file  . Highest education level: Not on file  Occupational History  . Not on file  Social Needs  . Financial resource strain: Not on  file  . Food insecurity:    Worry: Not on file    Inability: Not on file  . Transportation needs:    Medical: Not on file    Non-medical: Not on file  Tobacco Use  . Smoking status: Former Games developer  . Smokeless tobacco: Never Used  Substance and Sexual Activity  . Alcohol use: No  . Drug use: No  . Sexual activity: Not Currently    Birth control/protection: None  Lifestyle  . Physical activity:    Days per week: Not on file    Minutes per session: Not on file  . Stress: Not on file  Relationships  . Social connections:    Talks on phone: Not on file    Gets together: Not on file    Attends religious service: Not on file    Active member of club or organization: Not on file    Attends meetings of clubs or organizations: Not on file    Relationship status: Not on file  Other Topics Concern  . Not on file  Social History Narrative  . Not on file    Family History  Problem Relation Age of Onset  . Migraines Mother   . Depression Mother   . Migraines Maternal Grandmother   . Migraines Cousin     Facility-Administered Medications Prior to  Admission  Medication Dose Route Frequency Provider Last Rate Last Dose  . betamethasone acetate-betamethasone sodium phosphate (CELESTONE) injection 12 mg  12 mg Intramuscular Once Gunnar Bulla, CNM       Medications Prior to Admission  Medication Sig Dispense Refill Last Dose  . ferrous sulfate 325 (65 FE) MG tablet Take by mouth.   Past Week at Unknown time  . Prenatal Vit-Fe Fumarate-FA (PNV PRENATAL PLUS MULTIVITAMIN) 27-1 MG TABS Take by mouth.   04/06/2018 at Unknown time    Allergies  Allergen Reactions  . Nickel Rash    Review of Systems: Negative except for what is mentioned in HPI.  Physical Exam:  Pulse Rate:  [97] 97 (03/16 0921) BP: (125)/(78) 125/78 (03/16 0921) Weight:  [97.1 kg] 97.1 kg (03/16 0921)   GENERAL: Well-developed, well-nourished female in no acute distress.   LUNGS: Clear to  auscultation bilaterally.   HEART: Regular rate and rhythm.  ABDOMEN: Soft, nontender, nondistended, gravid.  EXTREMITIES: Nontender, no edema, 2+ distal pulses.  Cervical Exam: Dilation: 3 Effacement (%): 60 Station: -2 Presentation: Vertex Exam by:: Marcelino Duster Isaac Dubie,CNM  FHT:  Baseline rate 125 bpm   Variability moderate  Accelerations present   Decelerations none  Contractions: Irregular, soft resting tone   Pertinent Labs/Studies:   Results for orders placed or performed during the hospital encounter of 04/07/18 (from the past 24 hour(s))  ROM Plus ([facility] only)     Status: None   Collection Time: 04/07/18 10:10 AM  Result Value Ref Range   Rom Plus POSITIVE     Assessment :  Christy Schmidt is a 23 y.o. G2P0 at [redacted]w[redacted]d being admitted for augmentation due to full term premature rupture of membranes, Late entry prenatal care, Anemia in pregnancy, History of shoulder dystocia, History of immediate postpartum hemorrhage requiring blood transfusion, History of marijuana use, Group Beta Strep Positive  FHR Category I  Plan:  Admit to birthing suites, see orders.   Induction/Augmentation as needed, per protocol.  Delivery plan: Hopeful for vaginal delivery.  Agrees to activate management of the third stage of labor.   Dr. Valentino Saxon notified of admission and plan of care.    Gunnar Bulla, CNM [facility], [facility] 04/07/18 11:38 AM

## 2018-04-07 NOTE — Progress Notes (Signed)
Patient ID: Christy Schmidt, female   DOB: 09-Jan-1996, 23 y.o.   MRN: [id number]  Christy Schmidt is a 23 y.o. G2P1001 at [redacted]w[redacted]d by LMP admitted for full term premature rupture of membranes  Subjective:  Patient ambulating in room. Reports leakage of clear fluid with contractions and good fetal movement.   Denies difficulty breathing or respiratory distress, chest pain, abdominal pain, vaginal bleeding, dysuria, and leg pain or swelling.   FOB at bedside for continuous labor support.   Objective:  Temp:  [97.9 F (36.6 C)-98.4 F (36.9 C)] 98.3 F (36.8 C) (03/16 1630) Pulse Rate:  [92-97] 92 (03/16 1630) Resp:  [18-19] 18 (03/16 1150) BP: (106-125)/(49-78) 106/49 (03/16 1630) Weight:  [97.1 kg] 97.1 kg (03/16 0921)  Fetal Wellbeing:  Category I  UC:   regular, every two (2) to seven (7) minutes; pitocin infusing at 24 mu/min  SVE:   Dilation: 5 Effacement (%): 80 Station: -2 Exam by:: Marcelino Duster CNM  Vertex by bedside ultrasound  Labs:  Lab Results  Component Value Date   WBC 10.6 (H) 04/07/2018   HGB 9.0 (L) 04/07/2018   HCT 27.6 (L) 04/07/2018   MCV 89.9 04/07/2018   PLT 227 04/07/2018    Assessment:  Christy Schmidt is a 23 y.o. G2P0 at [redacted]w[redacted]d admitted for augmentation due to full term premature rupture of membranes, Late entry prenatal care, Anemia in pregnancy, History of shoulder dystocia, History of immediate postpartum hemorrhage requiring blood transfusion, History of marijuana use, Group Beta Strep Positive, Pitocin augmentation  FHR Category I  Plan:  AROM of forebag without difficulty.   Encouraged position change and use of peanut ball.   Continue orders as written. Reassess as needed.    Gunnar Bulla, CNM [facility], [facility] 04/07/2018, 4:59 PM

## 2018-04-08 ENCOUNTER — Encounter: Payer: Self-pay | Admitting: *Deleted

## 2018-04-08 DIAGNOSIS — O99824 Streptococcus B carrier state complicating childbirth: Secondary | ICD-10-CM

## 2018-04-08 DIAGNOSIS — Z3A37 37 weeks gestation of pregnancy: Secondary | ICD-10-CM

## 2018-04-08 DIAGNOSIS — O4202 Full-term premature rupture of membranes, onset of labor within 24 hours of rupture: Secondary | ICD-10-CM

## 2018-04-08 DIAGNOSIS — O9902 Anemia complicating childbirth: Secondary | ICD-10-CM

## 2018-04-08 LAB — RPR: RPR Ser Ql: NONREACTIVE

## 2018-04-08 LAB — CBC
HCT: 26.3 % — ABNORMAL LOW (ref 36.0–46.0)
Hemoglobin: 8.5 g/dL — ABNORMAL LOW (ref 12.0–15.0)
MCH: 29.4 pg (ref 26.0–34.0)
MCHC: 32.3 g/dL (ref 30.0–36.0)
MCV: 91 fL (ref 80.0–100.0)
NRBC: 0 % (ref 0.0–0.2)
Platelets: 237 10*3/uL (ref 150–400)
RBC: 2.89 MIL/uL — AB (ref 3.87–5.11)
RDW: 14.4 % (ref 11.5–15.5)
WBC: 21 10*3/uL — ABNORMAL HIGH (ref 4.0–10.5)

## 2018-04-08 MED ORDER — SENNOSIDES-DOCUSATE SODIUM 8.6-50 MG PO TABS
2.0000 | ORAL_TABLET | ORAL | Status: DC
  Administered 2018-04-09: 2 via ORAL
  Filled 2018-04-08: qty 2

## 2018-04-08 MED ORDER — SODIUM CHLORIDE 0.9% FLUSH: 3.0000 mL | INTRAVENOUS | Status: DC | PRN

## 2018-04-08 MED ORDER — OXYCODONE HCL 5 MG PO TABS
5.0000 mg | ORAL_TABLET | Freq: Four times a day (QID) | ORAL | Status: DC | PRN
  Administered 2018-04-08 – 2018-04-09 (×3): 5 mg via ORAL
  Filled 2018-04-08 (×3): qty 1

## 2018-04-08 MED ORDER — DIPHENHYDRAMINE HCL 25 MG PO CAPS: 25.0000 mg | ORAL_CAPSULE | Freq: Four times a day (QID) | ORAL | Status: DC | PRN

## 2018-04-08 MED ORDER — DIBUCAINE 1 % RE OINT: 1.0000 "application " | TOPICAL_OINTMENT | RECTAL | Status: DC | PRN

## 2018-04-08 MED ORDER — SODIUM CHLORIDE 0.9 % IV SOLN
510.0000 mg | Freq: Once | INTRAVENOUS | Status: AC
  Administered 2018-04-08: 510 mg via INTRAVENOUS
  Filled 2018-04-08: qty 17

## 2018-04-08 MED ORDER — PRENATAL MULTIVITAMIN CH
1.0000 | ORAL_TABLET | Freq: Every day | ORAL | Status: DC
  Administered 2018-04-08 – 2018-04-09 (×2): 1 via ORAL
  Filled 2018-04-08 (×2): qty 1

## 2018-04-08 MED ORDER — METHYLERGONOVINE MALEATE 0.2 MG PO TABS: 0.2000 mg | ORAL_TABLET | ORAL | Status: DC | PRN

## 2018-04-08 MED ORDER — ACETAMINOPHEN 325 MG PO TABS
650.0000 mg | ORAL_TABLET | ORAL | Status: DC | PRN
  Administered 2018-04-08 – 2018-04-09 (×5): 650 mg via ORAL
  Filled 2018-04-08 (×5): qty 2

## 2018-04-08 MED ORDER — IBUPROFEN 600 MG PO TABS
600.0000 mg | ORAL_TABLET | Freq: Four times a day (QID) | ORAL | Status: DC
  Administered 2018-04-08 – 2018-04-09 (×4): 600 mg via ORAL
  Filled 2018-04-08 (×4): qty 1

## 2018-04-08 MED ORDER — SODIUM CHLORIDE 0.9% FLUSH: 3.0000 mL | Freq: Two times a day (BID) | INTRAVENOUS | Status: DC

## 2018-04-08 MED ORDER — SODIUM CHLORIDE 0.9 % IV SOLN: 250.0000 mL | INTRAVENOUS | Status: DC | PRN

## 2018-04-08 MED ORDER — ONDANSETRON HCL 4 MG/2ML IJ SOLN: 4.0000 mg | INTRAMUSCULAR | Status: DC | PRN

## 2018-04-08 MED ORDER — METHYLERGONOVINE MALEATE 0.2 MG/ML IJ SOLN: 0.2000 mg | INTRAMUSCULAR | Status: DC | PRN

## 2018-04-08 MED ORDER — VITAMIN D (ERGOCALCIFEROL) 1.25 MG (50000 UNIT) PO CAPS
50000.0000 [IU] | ORAL_CAPSULE | ORAL | Status: DC
  Administered 2018-04-08: 50000 [IU] via ORAL
  Filled 2018-04-08: qty 1

## 2018-04-08 MED ORDER — SIMETHICONE 80 MG PO CHEW: 80.0000 mg | CHEWABLE_TABLET | ORAL | Status: DC | PRN

## 2018-04-08 MED ORDER — CYANOCOBALAMIN 1000 MCG/ML IJ SOLN
1000.0000 ug | Freq: Once | INTRAMUSCULAR | Status: AC
  Administered 2018-04-08: 1000 ug via INTRAMUSCULAR
  Filled 2018-04-08: qty 1

## 2018-04-08 MED ORDER — COCONUT OIL OIL: 1.0000 "application " | TOPICAL_OIL | Status: DC | PRN

## 2018-04-08 MED ORDER — ONDANSETRON HCL 4 MG PO TABS: 4.0000 mg | ORAL_TABLET | ORAL | Status: DC | PRN

## 2018-04-08 MED ORDER — MAGNESIUM OXIDE 400 (241.3 MG) MG PO TABS
400.0000 mg | ORAL_TABLET | Freq: Every day | ORAL | Status: DC
  Administered 2018-04-08: 400 mg via ORAL
  Filled 2018-04-08 (×2): qty 1

## 2018-04-08 MED ORDER — WITCH HAZEL-GLYCERIN EX PADS: 1.0000 "application " | MEDICATED_PAD | CUTANEOUS | Status: DC | PRN

## 2018-04-08 MED ORDER — BENZOCAINE-MENTHOL 20-0.5 % EX AERO
1.0000 "application " | INHALATION_SPRAY | CUTANEOUS | Status: DC | PRN
  Administered 2018-04-08: 1 via TOPICAL
  Filled 2018-04-08: qty 56

## 2018-04-08 NOTE — Anesthesia Postprocedure Evaluation (Signed)
Anesthesia Post Note  Patient: Christy Schmidt  Procedure(s) Performed: AN AD HOC LABOR EPIDURAL  Patient location during evaluation: Mother Baby Anesthesia Type: Epidural Level of consciousness: awake and alert and oriented Pain management: pain level controlled Vital Signs Assessment: post-procedure vital signs reviewed and stable Respiratory status: spontaneous breathing and nonlabored ventilation Cardiovascular status: stable Postop Assessment: no headache, no backache, patient able to bend at knees, no apparent nausea or vomiting, able to ambulate and adequate PO intake Anesthetic complications: no     Last Vitals:  Vitals:   04/08/18 0655 04/08/18 0700  BP: 127/67   Pulse: 99   Resp:    Temp:    SpO2: 98% 99%    Last Pain:  Vitals:   04/08/18 0530  TempSrc:   PainSc: 0-No pain                 Zachary George

## 2018-04-08 NOTE — Progress Notes (Addendum)
Patient ID: Christy Schmidt, female   DOB: 05-Feb-1995, 23 y.o.   MRN: [id number]  Christy Schmidt is a 23 y.o. G2P1001 at [redacted]w[redacted]d by LMP admitted for full term rupture of membranes  Subjective:  Reports pelvic pressure and the urge to push. FOB at bedside.   Denies difficulty breathing or respiratory distress, chest pain, abdominal pain, vaginal bleeding, dysuria, and leg pain or swelling.   FOB at bedside for continuous labor support.   Objective:  Temp:  [97.8 F (36.6 C)-101.5 F (38.6 C)] 101.5 F (38.6 C) (03/17 0210) Pulse Rate:  [82-121] 97 (03/16 2340) Resp:  [18-19] 18 (03/16 1910) BP: (96-129)/(42-88) 111/57 (03/16 2340) SpO2:  [95 %-100 %] 99 % (03/16 2340) Weight:  [97.1 kg] 97.1 kg (03/16 0921)  Fetal Wellbeing:  Category I  UC:   regular, every one (1) to four (4) minutes; soft resting tone; pitocin infusing at 40 mu/min  SVE:   Dilation: Lip/rim Effacement (%): 90 Station: -1 Exam by:: A. White, RN  Labs: Lab Results  Component Value Date   WBC 10.6 (H) 04/07/2018   HGB 9.0 (L) 04/07/2018   HCT 27.6 (L) 04/07/2018   MCV 89.9 04/07/2018   PLT 227 04/07/2018    Assessment:  Kimberlye A Leathis a 23 y.o.G2P0 at [redacted]w[redacted]d admitted for augmentation due to full term premature rupture of membranes, Late entry prenatal care, Anemia in pregnancy, History of shoulder dystocia, History of immediate postpartum hemorrhage requiring blood transfusion, History of marijuana use, Group Beta Strep Positive, Pitocin augmentation  FHR Category I  Plan:  Encouraged position change and use of peanut ball.   Will attempt trial of pushing due to pressure with contractions.  If unsuccessful, then will stop pitocin and restart.   Reviewed red flag symptoms and when to call.   Continue orders as written. Reassess as needed.   Update given to Dr. Valentino Saxon.    Gunnar Bulla, CNM [facility], [facility] 04/08/2018, 3:06 AM

## 2018-04-09 LAB — CBC
HCT: 24.7 % — ABNORMAL LOW (ref 36.0–46.0)
Hemoglobin: 8 g/dL — ABNORMAL LOW (ref 12.0–15.0)
MCH: 29.5 pg (ref 26.0–34.0)
MCHC: 32.4 g/dL (ref 30.0–36.0)
MCV: 91.1 fL (ref 80.0–100.0)
Platelets: 189 10*3/uL (ref 150–400)
RBC: 2.71 MIL/uL — ABNORMAL LOW (ref 3.87–5.11)
RDW: 14.5 % (ref 11.5–15.5)
WBC: 15 10*3/uL — ABNORMAL HIGH (ref 4.0–10.5)
nRBC: 0 % (ref 0.0–0.2)

## 2018-04-09 LAB — SURGICAL PATHOLOGY

## 2018-04-09 MED ORDER — IBUPROFEN 600 MG PO TABS
600.0000 mg | ORAL_TABLET | Freq: Four times a day (QID) | ORAL | Status: DC
  Administered 2018-04-09: 600 mg via ORAL
  Filled 2018-04-09: qty 1

## 2018-04-09 MED ORDER — OXYCODONE HCL 5 MG PO TABS: 5.0000 mg | ORAL_TABLET | Freq: Four times a day (QID) | ORAL | 0 refills | Status: DC | PRN

## 2018-04-09 MED ORDER — VITAMIN D (ERGOCALCIFEROL) 1.25 MG (50000 UNIT) PO CAPS: 50000.0000 [IU] | ORAL_CAPSULE | ORAL | 0 refills | Status: DC

## 2018-04-09 MED ORDER — MAGNESIUM OXIDE 400 (241.3 MG) MG PO TABS: 400.0000 mg | ORAL_TABLET | Freq: Every day | ORAL | 2 refills | Status: DC

## 2018-04-09 MED ORDER — IBUPROFEN 600 MG PO TABS: 600.0000 mg | ORAL_TABLET | Freq: Four times a day (QID) | ORAL | 0 refills | Status: DC

## 2018-04-09 NOTE — Clinical Social Work Maternal (Signed)
CLINICAL SOCIAL WORK MATERNAL/CHILD NOTE  Patient Details  Name: Christy Schmidt MRN: [id number] Date of Birth: 06/18/1995  Date:  04/09/2018  Clinical Social Worker Initiating Note:  York Spaniel MSW,LCSW Date/Time: Initiated:  04/09/18/      Child's Name:      Biological Parents:  Mother, Father   Need for Interpreter:  None   Reason for Referral:  Current Substance Use/Substance Use During Pregnancy    Address:  [address] [place] [address]    Phone number:  [phone] (home)     Additional phone number: none  Household Members/Support Persons (HM/SP):       HM/SP Name Relationship DOB or Age  HM/SP -1        HM/SP -2        HM/SP -3        HM/SP -4        HM/SP -5        HM/SP -6        HM/SP -7        HM/SP -8          Natural Supports (not living in the home):  Friends, Extended Family   Professional Supports: None   Employment:     Type of Work:     Education:      Homebound arranged:    Surveyor, quantity Resources:  Medicaid   Other Resources:      Cultural/Religious Considerations Which May Impact Care:  none  Strengths:  Ability to meet basic needs , Home prepared for child    Psychotropic Medications:         Pediatrician:       Pediatrician List:   [facility] [place]    [place]    [place] [facility]      Pediatrician Fax Number:    Risk Factors/Current Problems:  Substance Use    Cognitive State:  Alert , Able to Concentrate    Mood/Affect:  Tearful , Fearful    CSW Assessment: CSW spoke with patient this morning at her bedside. Her newborn was next to her in the bassinet. CSW introduced self and explained purpose of visit. Patient immediately tensed up in expression and mannerisms but was polite and answered questions appropriately. She states that in the home is her husband and their other child who is 3. She has all necessities for her family. She has no  concerns regarding transportation. Patient denies any history of mental illness and has been educated regarding postpartum depression. CSW began explaining her positive urine drug screen and baby's negative urine drug screen. Patient openly began sighing and appearing frustrated. CSW then informed her that the cord tissue was sent for testing and this was something that patient was not aware of. Furthermore, CSW explained that if the cord tissue returns positive for marijuana then a DSS Child Protective Services report will need to be made. At this point, patient became tearful and crying. She was scared that she was going to lose custody of her children. CSW provided therapeutic counseling and explained in detail how DSS Child Protective Services operates regarding case that are positive for marijuana. CSW offered to call her with the results once they were available. By end of conversation, patient was much calmer and appreciative of CSW assistance.  CSW Plan/Description:  CSW Will Continue to Monitor Umbilical Cord Tissue Drug Screen Results and Make Report if Warranted  [place], [place] 04/09/2018, 9:07 AM

## 2018-04-09 NOTE — Discharge Instructions (Signed)
Home Care Instructions for Mom  Activity  · Gradually return to your regular activities.  · Let yourself rest. Nap while your baby sleeps.  · Avoid lifting anything that is heavier than 10 lb (4.5 kg) until your health care provider says it is okay.  · Avoid activities that take a lot of effort and energy (are strenuous) until approved by your health care provider. Walking at a slow-to-moderate pace is usually safe.  · If you had a cesarean delivery:  ? Do not vacuum, climb stairs, or drive a car for 4-6 weeks.  ? Have someone help you at home until you feel like you can do your usual activities yourself.  ? Do exercises as told by your health care provider, if this applies.  Vaginal bleeding  You may continue to bleed for 4-6 weeks after delivery. Over time, the amount of blood usually decreases and the color of the blood usually gets lighter. However, the flow of bright red blood may increase if you have been too active. If you need to use more than one pad in an hour because your pad gets soaked, or if you pass a large clot:  · Lie down.  · Raise your feet.  · Place a cold compress on your lower abdomen.  · Rest.  · Call your health care provider.  If you are breastfeeding, your period should return anytime between 8 weeks after delivery and the time that you stop breastfeeding. If you are not breastfeeding, your period should return 6-8 weeks after delivery.  Perineal care  The perineal area, or perineum, is the part of your body between your thighs. After delivery, this area needs special care. Follow these instructions as told by your health care provider.  · Take warm tub baths for 15-20 minutes.  · Use medicated pads and pain-relieving sprays and creams as told.  · Do not use tampons or douches until vaginal bleeding has stopped.  · Each time you go to the bathroom:  ? Use a peri bottle.  ? Change your pad.  ? Use towelettes in place of toilet paper until your stitches have healed.  · Do Kegel exercises  every day. Kegel exercises help to maintain the muscles that support the vagina, bladder, and bowels. You can do these exercises while you are standing, sitting, or lying down. To do Kegel exercises:  ? Tighten the muscles of your abdomen and the muscles that surround your birth canal.  ? Hold for a few seconds.  ? Relax.  ? Repeat until you have done this 5 times in a row.  · To prevent hemorrhoids from developing or getting worse:  ? Drink enough fluid to keep your urine clear or pale yellow.  ? Avoid straining when having a bowel movement.  ? Take over-the-counter medicines and stool softeners as told by your health care provider.  Breast care  · Wear a tight-fitting bra.  · Avoid taking over-the-counter pain medicine for breast discomfort.  · Apply ice to the breasts to help with discomfort as needed:  ? Put ice in a plastic bag.  ? Place a towel between your skin and the bag.  ? Leave the ice on for 20 minutes or as told by your health care provider.  Nutrition  · Eat a well-balanced diet.  · Do not try to lose weight quickly by cutting back on calories.  · Take your prenatal vitamins until your postpartum checkup or until your health care provider tells   you have postpartum depression, get support from your partner, friends, and family. If the depression does not go away on its own after several weeks, contact your health care provider. Breast self-exam  Do a breast self-exam each month, at the same time of the month. If you are breastfeeding, check your breasts just after a feeding, when your breasts are less full. If you are breastfeeding and your period has started, check your breasts on day 5, 6, or 7 of your  period. Report any lumps, bumps, or discharge to your health care provider. Know that breasts are normally lumpy if you are breastfeeding. This is temporary, and it is not a health risk. Intimacy and sexuality Avoid sexual activity for at least 3-4 weeks after delivery or until the brownish-red vaginal flow is completely gone. If you want to avoid pregnancy, use some form of birth control. You can get pregnant after delivery, even if you have not had your period. Contact a health care provider if:  You feel unable to cope with the changes that a child brings to your life, and these feelings do not go away after several weeks.  You notice a lump, a bump, or discharge on your breast. Get help right away if:  Blood soaks your pad in 1 hour or less.  You have: ? Severe pain or cramping in your lower abdomen. ? A bad-smelling vaginal discharge. ? A fever that is not controlled by medicine. ? A fever, and an area of your breast is red and sore. ? Pain or redness in your calf. ? Sudden, severe chest pain. ? Shortness of breath. ? Painful or bloody urination. ? Problems with your vision. ? You vomit for 12 hours or longer. ? You develop a severe headache. ? You have serious thoughts about hurting yourself, your child, or anyone else. This information is not intended to replace advice given to you by your health care provider. Make sure you discuss any questions you have with your health care provider. Document Released: 01/06/2000 Document Revised: 03/06/2017 Document Reviewed: 07/12/2014 Elsevier Interactive Patient Education  2019 [employer] Vaginal Delivery, Care After Refer to this sheet in the next few weeks. These instructions provide you with information about caring for yourself after vaginal delivery. Your health care provider may also give you more specific instructions. Your treatment has been planned according to current medical practices, but problems sometimes occur. Call your  health care provider if you have any problems or questions. What can I expect after the procedure? After vaginal delivery, it is common to have:  Some bleeding from your vagina.  Soreness in your abdomen, your vagina, and the area of skin between your vaginal opening and your anus (perineum).  Pelvic cramps.  Fatigue. Follow these instructions at home: Medicines  Take over-the-counter and prescription medicines only as told by your health care provider.  If you were prescribed an antibiotic medicine, take it as told by your health care provider. Do not stop taking the antibiotic until it is finished. Driving   Do not drive or operate heavy machinery while taking prescription pain medicine.  Do not drive for 24 hours if you received a sedative. Lifestyle  Do not drink alcohol. This is especially important if you are breastfeeding or taking medicine to relieve pain.  Do not use tobacco products, including cigarettes, chewing tobacco, or e-cigarettes. If you need help quitting, ask your health care provider. Eating and drinking  Drink at least 8 eight-ounce glasses of water every  day unless you are told not to by your health care provider. If you choose to breastfeed your baby, you may need to drink more water than this.  Eat high-fiber foods every day. These foods may help prevent or relieve constipation. High-fiber foods include: ? Whole grain cereals and breads. ? Brown rice. ? Beans. ? Fresh fruits and vegetables. Activity  Return to your normal activities as told by your health care provider. Ask your health care provider what activities are safe for you.  Rest as much as possible. Try to rest or take a nap when your baby is sleeping.  Do not lift anything that is heavier than your baby or 10 lb (4.5 kg) until your health care provider says that it is safe.  Talk with your health care provider about when you can engage in sexual activity. This may depend on  your: ? Risk of infection. ? Rate of healing. ? Comfort and desire to engage in sexual activity. Vaginal Care  If you have an episiotomy or a vaginal tear, check the area every day for signs of infection. Check for: ? More redness, swelling, or pain. ? More fluid or blood. ? Warmth. ? Pus or a bad smell.  Do not use tampons or douches until your health care provider says this is safe.  Watch for any blood clots that may pass from your vagina. These may look like clumps of dark red, brown, or black discharge. General instructions  Keep your perineum clean and dry as told by your health care provider.  Wear loose, comfortable clothing.  Wipe from front to back when you use the toilet.  Ask your health care provider if you can shower or take a bath. If you had an episiotomy or a perineal tear during labor and delivery, your health care provider may tell you not to take baths for a certain length of time.  Wear a bra that supports your breasts and fits you well.  If possible, have someone help you with household activities and help care for your baby for at least a few days after you leave the hospital.  Keep all follow-up visits for you and your baby as told by your health care provider. This is important. Contact a health care provider if:  You have: ? Vaginal discharge that has a bad smell. ? Difficulty urinating. ? Pain when urinating. ? A sudden increase or decrease in the frequency of your bowel movements. ? More redness, swelling, or pain around your episiotomy or vaginal tear. ? More fluid or blood coming from your episiotomy or vaginal tear. ? Pus or a bad smell coming from your episiotomy or vaginal tear. ? A fever. ? A rash. ? Little or no interest in activities you used to enjoy. ? Questions about caring for yourself or your baby.  Your episiotomy or vaginal tear feels warm to the touch.  Your episiotomy or vaginal tear is separating or does not appear to be  healing.  Your breasts are painful, hard, or turn red.  You feel unusually sad or worried.  You feel nauseous or you vomit.  You pass large blood clots from your vagina. If you pass a blood clot from your vagina, save it to show to your health care provider. Do not flush blood clots down the toilet without having your health care provider look at them.  You urinate more than usual.  You are dizzy or light-headed.  You have not breastfed at all and you  have not had a menstrual period for 12 weeks after delivery.  You have stopped breastfeeding and you have not had a menstrual period for 12 weeks after you stopped breastfeeding. Get help right away if:  You have: ? Pain that does not go away or does not get better with medicine. ? Chest pain. ? Difficulty breathing. ? Blurred vision or spots in your vision. ? Thoughts about hurting yourself or your baby.  You develop pain in your abdomen or in one of your legs.  You develop a severe headache.  You faint.  You bleed from your vagina so much that you fill two sanitary pads in one hour. This information is not intended to replace advice given to you by your health care provider. Make sure you discuss any questions you have with your health care provider. Document Released: 01/06/2000 Document Revised: 06/22/2015 Document Reviewed: 01/23/2015 Elsevier Interactive Patient Education  2019 [employer] Postpartum Care After Vaginal Delivery This sheet gives you information about how to care for yourself from the time you deliver your baby to up to 6-12 weeks after delivery (postpartum period). Your health care provider may also give you more specific instructions. If you have problems or questions, contact your health care provider. Follow these instructions at home: Vaginal bleeding  It is normal to have vaginal bleeding (lochia) after delivery. Wear a sanitary pad for vaginal bleeding and discharge. ? During the first week after  delivery, the amount and appearance of lochia is often similar to a menstrual period. ? Over the next few weeks, it will gradually decrease to a dry, yellow-brown discharge. ? For most women, lochia stops completely by 4-6 weeks after delivery. Vaginal bleeding can vary from woman to woman.  Change your sanitary pads frequently. Watch for any changes in your flow, such as: ? A sudden increase in volume. ? A change in color. ? Large blood clots.  If you pass a blood clot from your vagina, save it and call your health care provider to discuss. Do not flush blood clots down the toilet before talking with your health care provider.  Do not use tampons or douches until your health care provider says this is safe.  If you are not breastfeeding, your period should return 6-8 weeks after delivery. If you are feeding your child breast milk only (exclusive breastfeeding), your period may not return until you stop breastfeeding. Perineal care  Keep the area between the vagina and the anus (perineum) clean and dry as told by your health care provider. Use medicated pads and pain-relieving sprays and creams as directed.  If you had a cut in the perineum (episiotomy) or a tear in the vagina, check the area for signs of infection until you are healed. Check for: ? More redness, swelling, or pain. ? Fluid or blood coming from the cut or tear. ? Warmth. ? Pus or a bad smell.  You may be given a squirt bottle to use instead of wiping to clean the perineum area after you go to the bathroom. As you start healing, you may use the squirt bottle before wiping yourself. Make sure to wipe gently.  To relieve pain caused by an episiotomy, a tear in the vagina, or swollen veins in the anus (hemorrhoids), try taking a warm sitz bath 2-3 times a day. A sitz bath is a warm water bath that is taken while you are sitting down. The water should only come up to your hips and should cover your buttocks.  Breast  care  Within the first few days after delivery, your breasts may feel heavy, full, and uncomfortable (breast engorgement). Milk may also leak from your breasts. Your health care provider can suggest ways to help relieve the discomfort. Breast engorgement should go away within a few days.  If you are breastfeeding: ? Wear a bra that supports your breasts and fits you well. ? Keep your nipples clean and dry. Apply creams and ointments as told by your health care provider. ? You may need to use breast pads to absorb milk that leaks from your breasts. ? You may have uterine contractions every time you breastfeed for up to several weeks after delivery. Uterine contractions help your uterus return to its normal size. ? If you have any problems with breastfeeding, work with your health care provider or [employer].  If you are not breastfeeding: ? Avoid touching your breasts a lot. Doing this can make your breasts produce more milk. ? Wear a good-fitting bra and use cold packs to help with swelling. ? Do not squeeze out (express) milk. This causes you to make more milk. Intimacy and sexuality  Ask your health care provider when you can engage in sexual activity. This may depend on: ? Your risk of infection. ? How fast you are healing. ? Your comfort and desire to engage in sexual activity.  You are able to get pregnant after delivery, even if you have not had your period. If desired, talk with your health care provider about methods of birth control (contraception). Medicines  Take over-the-counter and prescription medicines only as told by your health care provider.  If you were prescribed an antibiotic medicine, take it as told by your health care provider. Do not stop taking the antibiotic even if you start to feel better. Activity  Gradually return to your normal activities as told by your health care provider. Ask your health care provider what activities are safe for  you.  Rest as much as possible. Try to rest or take a nap while your baby is sleeping. Eating and drinking   Drink enough fluid to keep your urine pale yellow.  Eat high-fiber foods every day. These may help prevent or relieve constipation. High-fiber foods include: ? Whole grain cereals and breads. ? Brown rice. ? Beans. ? Fresh fruits and vegetables.  Do not try to lose weight quickly by cutting back on calories.  Take your prenatal vitamins until your postpartum checkup or until your health care provider tells you it is okay to stop. Lifestyle  Do not use any products that contain nicotine or tobacco, such as cigarettes and e-cigarettes. If you need help quitting, ask your health care provider.  Do not drink alcohol, especially if you are breastfeeding. General instructions  Keep all follow-up visits for you and your baby as told by your health care provider. Most women visit their health care provider for a postpartum checkup within the first 3-6 weeks after delivery. Contact a health care provider if:  You feel unable to cope with the changes that your child brings to your life, and these feelings do not go away.  You feel unusually sad or worried.  Your breasts become red, painful, or hard.  You have a fever.  You have trouble holding urine or keeping urine from leaking.  You have little or no interest in activities you used to enjoy.  You have not breastfed at all and you have not had a menstrual period for 12  weeks after delivery.  You have stopped breastfeeding and you have not had a menstrual period for 12 weeks after you stopped breastfeeding.  You have questions about caring for yourself or your baby.  You pass a blood clot from your vagina. Get help right away if:  You have chest pain.  You have difficulty breathing.  You have sudden, severe leg pain.  You have severe pain or cramping in your lower abdomen.  You bleed from your vagina so much that  you fill more than one sanitary pad in one hour. Bleeding should not be heavier than your heaviest period.  You develop a severe headache.  You faint.  You have blurred vision or spots in your vision.  You have bad-smelling vaginal discharge.  You have thoughts about hurting yourself or your baby. If you ever feel like you may hurt yourself or others, or have thoughts about taking your own life, get help right away. You can go to the nearest emergency department or call:  Your local emergency services (911 in the [place]  A suicide crisis helpline, such as the National Suicide Prevention Lifeline at 647-440-6195. This is open 24 hours a day. Summary  The period of time right after you deliver your newborn up to 6-12 weeks after delivery is called the postpartum period.  Gradually return to your normal activities as told by your health care provider.  Keep all follow-up visits for you and your baby as told by your health care provider. This information is not intended to replace advice given to you by your health care provider. Make sure you discuss any questions you have with your health care provider. Document Released: 11/05/2006 Document Revised: 10/22/2016 Document Reviewed: 10/22/2016 Elsevier Interactive Patient Education  2019 [employer]. Postpartum Baby Blues The postpartum period begins right after the birth of a baby. During this time, there is often a lot of joy and excitement. It is also a time of many changes in the life of the parents. No matter how many times a mother gives birth, each child brings new challenges to the family, including different ways of relating to one another. It is common to have feelings of excitement along with confusing changes in moods, emotions, and thoughts. You may feel happy one minute and sad or stressed the next. These feelings of sadness usually happen in the period right after you have your baby, and they go away within a week or two.  This is called the "baby blues." What are the causes? There is no known cause of baby blues. It is likely caused by a combination of factors. However, changes in hormone levels after childbirth are believed to trigger some of the symptoms. Other factors that can play a role in these mood changes include:  Lack of sleep.  Stressful life events, such as poverty, caring for a loved one, or death of a loved one.  Genetics. What are the signs or symptoms? Symptoms of this condition include:  Brief changes in mood, such as going from extreme happiness to sadness.  Decreased concentration.  Difficulty sleeping.  Crying spells and tearfulness.  Loss of appetite.  Irritability.  Anxiety. If the symptoms of baby blues last for more than 2 weeks or become more severe, you may have postpartum depression. How is this diagnosed? This condition is diagnosed based on an evaluation of your symptoms. There are no medical or lab tests that lead to a diagnosis, but there are various questionnaires that a health care  provider may use to identify women with the baby blues or postpartum depression. How is this treated? Treatment is not needed for this condition. The baby blues usually go away on their own in 1-2 weeks. Social support is often all that is needed. You will be encouraged to get adequate sleep and rest. Follow these instructions at home: Lifestyle      Get as much rest as you can. Take a nap when the baby sleeps.  Exercise regularly as told by your health care provider. Some women find yoga and walking to be helpful.  Eat a balanced and nourishing diet. This includes plenty of fruits and vegetables, whole grains, and lean proteins.  Do little things that you enjoy. Have a cup of tea, take a bubble bath, read your favorite magazine, or listen to your favorite music.  Avoid alcohol.  Ask for help with household chores, cooking, grocery shopping, or running errands. Do not try to do  everything yourself. Consider hiring a postpartum doula to help. This is a professional who specializes in providing support to new mothers.  Try not to make any major life changes during pregnancy or right after giving birth. This can add stress. General instructions  Talk to people close to you about how you are feeling. Get support from your partner, family members, friends, or other new moms. You may want to join a support group.  Find ways to cope with stress. This may include: ? Writing your thoughts and feelings in a journal. ? Spending time outside. ? Spending time with people who make you laugh.  Try to stay positive in how you think. Think about the things you are grateful for.  Take over-the-counter and prescription medicines only as told by your health care provider.  Let your health care provider know if you have any concerns.  Keep all postpartum visits as told by your health care provider. This is important. Contact a health care provider if:  Your baby blues do not go away after 2 weeks. Get help right away if:  You have thoughts of taking your own life (suicidal thoughts).  You think you may harm the baby or other people.  You see or hear things that are not there (hallucinations). Summary  After giving birth, you may feel happy one minute and sad or stressed the next. Feelings of sadness that happen right after the baby is born and go away after a week or two are called the "baby blues."  You can manage the baby blues by getting enough rest, eating a healthy diet, exercising, spending time with supportive people, and finding ways to cope with stress.  If feelings of sadness and stress last longer than 2 weeks or get in the way of caring for your baby, talk to your health care provider. This may mean you have postpartum depression. This information is not intended to replace advice given to you by your health care provider. Make sure you discuss any questions you  have with your health care provider. Document Released: 10/13/2003 Document Revised: 03/06/2016 Document Reviewed: 03/06/2016 Elsevier Interactive Patient Education  2019 [employer].

## 2018-04-09 NOTE — Plan of Care (Signed)
Vs stable; up ad lib; tolerating regular diet; taking motrin, tylenol and roxicodone for pain control; heating pad as needed; has asked about being discharged today

## 2018-04-09 NOTE — Discharge Summary (Signed)
Physician Obstetric Discharge Summary  Patient ID: Christy Schmidt MRN: [id number] DOB/AGE: September 30, 1995 22 y.o.   Date of Admission: 04/07/2018  Date of Discharge:  04/09/18  Admitting Diagnosis: Premature rupture of membrane at [redacted]w[redacted]d  Secondary Diagnosis: Anemia in pregnancy, Vitamin B-12 and D Deficiency, History of shoulder dystocia, History of postpartum hemorrhage, History marijuana use  Mode of Delivery: Normal spontaneous vaginal delivery     Discharge Diagnosis: No other diagnosis   Intrapartum Procedures: Atificial rupture of membranes, epidural, GBS prophylaxis, pitocin augmentation and placement of intrauterine catheter   Post partum procedures: IV Iron transfusion  Complications: None   Brief Hospital Course   Christy Schmidt is a G2P2002 who had a SVD on 04/08/2018;  for further details of this birth, please refer to the delivey note.  Patient had an uncomplicated postpartum course.  By time of discharge on PPD#1, her pain was controlled on oral pain medications; she had appropriate lochia and was ambulating, voiding without difficulty and tolerating regular diet.  She was deemed stable for discharge to home.    Labs: CBC Latest Ref Rng & Units 04/09/2018 04/08/2018 04/07/2018  WBC 4.0 - 10.5 K/uL 15.0(H) 21.0(H) 10.6(H)  Hemoglobin 12.0 - 15.0 g/dL 8.0(L) 8.5(L) 9.0(L)  Hematocrit 36.0 - 46.0 % 24.7(L) 26.3(L) 27.6(L)  Platelets 150 - 400 K/uL 189 237 227   O POS  Physical exam:   Temp:  [97.7 F (36.5 C)-98.6 F (37 C)] 98.2 F (36.8 C) (03/18 0727) Pulse Rate:  [67-77] 67 (03/18 0727) Resp:  [18-20] 20 (03/18 0727) BP: (101-122)/(55-76) 117/76 (03/18 0727) SpO2:  [97 %-100 %] 100 % (03/18 0727)  General: alert and no distress  Lochia: appropriate  Abdomen: soft, NT  Uterine Fundus: firm  Extremities: No evidence of DVT seen on physical exam. No lower extremity edema.  Discharge Instructions: Per After Visit Summary  Activity: Advance as  tolerated. Pelvic rest for 6 weeks.  Also refer to After Visit Summary  Diet: Regular  Medications: Allergies as of 04/09/2018      Reactions   Nickel Rash      Medication List    TAKE these medications   ferrous sulfate 325 (65 FE) MG tablet Take by mouth.   ibuprofen 600 MG tablet Commonly known as:  ADVIL,MOTRIN Take 1 tablet (600 mg total) by mouth every 6 (six) hours.   magnesium oxide 400 (241.3 Mg) MG tablet Commonly known as:  MAG-OX Take 1 tablet (400 mg total) by mouth daily.   oxyCODONE 5 MG immediate release tablet Commonly known as:  Oxy IR/ROXICODONE Take 1 tablet (5 mg total) by mouth every 6 (six) hours as needed for severe pain.   PNV Prenatal Plus Multivitamin 27-1 MG Tabs Take by mouth.   Vitamin D (Ergocalciferol) 1.25 MG (50000 UT) Caps capsule Commonly known as:  DRISDOL Take 1 capsule (50,000 Units total) by mouth every 7 (seven) days. Start taking on:  April 15, 2018      Outpatient follow up:  Follow-up Information    Gunnar Bulla, CNM. Schedule an appointment as soon as possible for a visit.   Specialties:  Certified Nurse Midwife, Obstetrics and Gynecology, Radiology Why:  Please make a two (2) week postpartum visit with [facility]; Please make a six (6) week postpartum visit with [facility] Contact information: [address] [place] [address] [phone]          Postpartum contraception: Nexplanon  Discharged Condition: stable  Discharged to: home  Newborn Data:  Disposition:home with mother  Apgars: APGAR (1 MIN): 8   APGAR (5 MINS): 9    Baby Feeding: Bottle   Gunnar Bulla, CNM [facility], [facility] 04/09/18 8:25 AM

## 2018-04-09 NOTE — Progress Notes (Signed)
Discharge instructions and follow up appointment given to and reviewed with parents. Parents verbalized understanding. Infant cord clamp and security transponder removed. Armbands matched to parents. Escorted out with parents by RN 

## 2018-04-10 ENCOUNTER — Ambulatory Visit: Admission: RE | Admit: 2018-04-10 | Payer: Medicaid Other | Source: Ambulatory Visit

## 2018-04-11 ENCOUNTER — Encounter: Payer: Medicaid Other | Admitting: Certified Nurse Midwife

## 2018-04-16 ENCOUNTER — Ambulatory Visit: Payer: Medicaid Other

## 2018-04-18 ENCOUNTER — Telehealth: Payer: Self-pay

## 2018-04-18 NOTE — Telephone Encounter (Signed)
lmtrc Needs to be prescreened.

## 2018-04-21 ENCOUNTER — Encounter: Payer: Medicaid Other | Admitting: Certified Nurse Midwife

## 2018-04-22 DIAGNOSIS — O1200 Gestational edema, unspecified trimester: Secondary | ICD-10-CM | POA: Diagnosis not present

## 2018-04-24 NOTE — Progress Notes (Signed)
Coronavirus (COVID-19) Are you at risk?  Are you at risk for the Coronavirus (COVID-19)?  To be considered HIGH RISK for Coronavirus (COVID-19), you have to meet the following criteria:  . Traveled to [place], [place], [place], [place] or [place]; or in the [place] to [place], [place], [place], or [place]; and have fever, cough, and shortness of breath within the last 2 weeks of travel OR . Been in close contact with a person diagnosed with COVID-19 within the last 2 weeks and have fever, cough, and shortness of breath . IF YOU DO NOT MEET THESE CRITERIA, YOU ARE CONSIDERED LOW RISK FOR COVID-19.  What to do if you are HIGH RISK for COVID-19?  . If you are having a medical emergency, call 911. . Seek medical care right away. Before you go to a doctor's office, urgent care or emergency department, call ahead and tell them about your recent travel, contact with someone diagnosed with COVID-19, and your symptoms. You should receive instructions from your physician's office regarding next steps of care.  . When you arrive at healthcare provider, tell the healthcare staff immediately you have returned from visiting [place], [place], [place], [place] or [place]; or traveled in the [place] to [place], [place], [place], or [place]; in the last two weeks or you have been in close contact with a person diagnosed with COVID-19 in the last 2 weeks.   . Tell the health care staff about your symptoms: fever, cough and shortness of breath. . After you have been seen by a medical provider, you will be either: o Tested for (COVID-19) and discharged home on quarantine except to seek medical care if symptoms worsen, and asked to  - Stay home and avoid contact with others until you get your results (4-5 days)  - Avoid travel on public transportation if possible (such as bus, train, or airplane) or o Sent to the Emergency Department by EMS for evaluation, COVID-19 testing, and possible  admission depending on your condition and test results.  What to do if you are LOW RISK for COVID-19?  Reduce your risk of any infection by using the same precautions used for avoiding the common cold or flu:  . Wash your hands often with soap and warm water for at least 20 seconds.  If soap and water are not readily available, use an alcohol-based hand sanitizer with at least 60% alcohol.  . If coughing or sneezing, cover your mouth and nose by coughing or sneezing into the elbow areas of your shirt or coat, into a tissue or into your sleeve (not your hands). . Avoid shaking hands with others and consider head nods or verbal greetings only. . Avoid touching your eyes, nose, or mouth with unwashed hands.  . Avoid close contact with people who are [place]. . Avoid places or events with large numbers of people in one location, like concerts or sporting events. . Carefully consider travel plans you have or are making. . If you are planning any travel outside or inside the [place], visit the CDC's Travelers' Health webpage for the latest health notices. . If you have some symptoms but not all symptoms, continue to monitor at home and seek medical attention if your symptoms worsen. . If you are having a medical emergency, call 911. 04/24/18 screening negative sls  ADDITIONAL HEALTHCARE OPTIONS FOR PATIENTS  [place] [facility] / e-Visit: [email or web address]         [facility]: [phone]    [facility]: [phone]                   [facility]: [phone]  

## 2018-04-25 ENCOUNTER — Ambulatory Visit (INDEPENDENT_AMBULATORY_CARE_PROVIDER_SITE_OTHER): Payer: Medicaid Other | Admitting: Certified Nurse Midwife

## 2018-04-25 ENCOUNTER — Other Ambulatory Visit: Payer: Self-pay

## 2018-04-25 VITALS — BP 132/93 | HR 80 | Ht 66.0 in | Wt 196.4 lb

## 2018-04-25 DIAGNOSIS — Z30017 Encounter for initial prescription of implantable subdermal contraceptive: Secondary | ICD-10-CM | POA: Diagnosis not present

## 2018-04-25 NOTE — Patient Instructions (Signed)
Nexplanon Instructions After Insertion   Keep bandage clean and dry for 24 hours   May use ice/Tylenol/Ibuprofen for soreness or pain   If you develop fever, drainage or increased warmth from incision site-contact office immediately Etonogestrel implant What is this medicine? ETONOGESTREL (et oh noe JES trel) is a contraceptive (birth control) device. It is used to prevent pregnancy. It can be used for up to 3 years. This medicine may be used for other purposes; ask your health care provider or pharmacist if you have questions. COMMON BRAND NAME(S): Implanon, Nexplanon What should I tell my health care provider before I take this medicine? They need to know if you have any of these conditions: -abnormal vaginal bleeding -blood vessel disease or blood clots -breast, cervical, endometrial, ovarian, liver, or uterine cancer -diabetes -gallbladder disease -heart disease or recent heart attack -high blood pressure -high cholesterol or triglycerides -kidney disease -liver disease -migraine headaches -seizures -stroke -tobacco smoker -an unusual or allergic reaction to etonogestrel, anesthetics or antiseptics, other medicines, foods, dyes, or preservatives -pregnant or trying to get pregnant -breast-feeding How should I use this medicine? This device is inserted just under the skin on the inner side of your upper arm by a health care professional. Talk to your pediatrician regarding the use of this medicine in children. Special care may be needed. Overdosage: If you think you have taken too much of this medicine contact a poison control center or emergency room at once. NOTE: This medicine is only for you. Do not share this medicine with others. What if I miss a dose? This does not apply. What may interact with this medicine? Do not take this medicine with any of the following medications: -amprenavir -fosamprenavir This medicine may also interact with the following  medications: -acitretin -aprepitant -armodafinil -bexarotene -bosentan -carbamazepine -certain medicines for fungal infections like fluconazole, ketoconazole, itraconazole and voriconazole -certain medicines to treat hepatitis, HIV or AIDS -cyclosporine -felbamate -griseofulvin -lamotrigine -modafinil -oxcarbazepine -phenobarbital -phenytoin -primidone -rifabutin -rifampin -rifapentine -St. John's wort -topiramate This list may not describe all possible interactions. Give your health care provider a list of all the medicines, herbs, non-prescription drugs, or dietary supplements you use. Also tell them if you smoke, drink alcohol, or use illegal drugs. Some items may interact with your medicine. What should I watch for while using this medicine? This product does not protect you against HIV infection (AIDS) or other sexually transmitted diseases. You should be able to feel the implant by pressing your fingertips over the skin where it was inserted. Contact your doctor if you cannot feel the implant, and use a non-hormonal birth control method (such as condoms) until your doctor confirms that the implant is in place. Contact your doctor if you think that the implant may have broken or become bent while in your arm. You will receive a user card from your health care provider after the implant is inserted. The card is a record of the location of the implant in your upper arm and when it should be removed. Keep this card with your health records. What side effects may I notice from receiving this medicine? Side effects that you should report to your doctor or health care professional as soon as possible: -allergic reactions like skin rash, itching or hives, swelling of the face, lips, or tongue -breast lumps, breast tissue changes, or discharge -breathing problems -changes in emotions or moods -if you feel that the implant may have broken or bent while in your arm -high blood    pressure -pain, irritation, swelling, or bruising at the insertion site -scar at site of insertion -signs of infection at the insertion site such as fever, and skin redness, pain or discharge -signs and symptoms of a blood clot such as breathing problems; changes in vision; chest pain; severe, sudden headache; pain, swelling, warmth in the leg; trouble speaking; sudden numbness or weakness of the face, arm or leg -signs and symptoms of liver injury like dark yellow or brown urine; general ill feeling or flu-like symptoms; light-colored stools; loss of appetite; nausea; right upper belly pain; unusually weak or tired; yellowing of the eyes or skin -unusual vaginal bleeding, discharge Side effects that usually do not require medical attention (report to your doctor or health care professional if they continue or are bothersome): -acne -breast pain or tenderness -headache -irregular menstrual bleeding -nausea This list may not describe all possible side effects. Call your doctor for medical advice about side effects. You may report side effects to FDA at 1-800-FDA-1088. Where should I keep my medicine? This drug is given in a hospital or clinic and will not be stored at home. NOTE: This sheet is a summary. It may not cover all possible information. If you have questions about this medicine, talk to your doctor, pharmacist, or health care provider.  2019 Elsevier/Gold Standard (2016-11-27 14:11:42)   

## 2018-04-25 NOTE — Progress Notes (Signed)
Christy Schmidt is a 23 y.o. year old African American female here for Nexplanon insertion. She is two (2) weeks postpartum and has not resumed intercourse at this time.    Risks/benefits/side effects of Nexplanon have been discussed and her questions have been answered.  Specifically, a failure rate of 01/998 has been reported, with an increased failure rate if pt takes St. John's Wort and/or antiseizure medicaitons.    Christy Schmidt is aware of the common side effect of irregular bleeding, which the incidence of decreases over time.  BP (!) 132/93   Pulse 80   Ht 5\' 6"  (1.676 m)   Wt 196 lb 7 oz (89.1 kg)   BMI 31.71 kg/m   She is right-handed, so her left arm, approximately 4 inches proximal from the elbow, was cleansed with alcohol and anesthetized with 2cc of 2% Lidocaine.  The area was cleansed again with betadine and the Nexplanon was inserted per manufacturer's recommendations without difficulty.  A steri-strip and pressure bandage were applied.  Pt was instructed to keep the area clean and dry, remove pressure bandage in 24 hours, and keep insertion site covered with the steri-strip for 3-5 days.  Back up contraception was recommended for 2 weeks.  She was given a card indicating date Nexplanon was inserted and date it needs to be removed.   Reviewed red flag symptoms and when to call.   RTC x 2 weeks for PPV or sooner if needed.    Gunnar Bulla, CNM [facility], [facility] 04/25/18 8:20 AM   NDC: 9924-2683-41 Exp: 07/18/2020 Lot: [id number]

## 2018-05-05 ENCOUNTER — Encounter: Payer: Medicaid Other | Admitting: Certified Nurse Midwife

## 2018-05-08 ENCOUNTER — Telehealth: Payer: Self-pay

## 2018-05-08 NOTE — Telephone Encounter (Signed)
Coronavirus (COVID-19) Are you at risk?  Are you at risk for the Coronavirus (COVID-19)?  To be considered HIGH RISK for Coronavirus (COVID-19), you have to meet the following criteria:  . Traveled to [place], [place], [place], [place] or [place]; or in the [place] to [place], [place], [place], or [place]; and have fever, cough, and shortness of breath within the last 2 weeks of travel OR . Been in close contact with a person diagnosed with COVID-19 within the last 2 weeks and have fever, cough, and shortness of breath . IF YOU DO NOT MEET THESE CRITERIA, YOU ARE CONSIDERED LOW RISK FOR COVID-19.  What to do if you are HIGH RISK for COVID-19?  . If you are having a medical emergency, call 911. . Seek medical care right away. Before you go to a doctor's office, urgent care or emergency department, call ahead and tell them about your recent travel, contact with someone diagnosed with COVID-19, and your symptoms. You should receive instructions from your physician's office regarding next steps of care.  . When you arrive at healthcare provider, tell the healthcare staff immediately you have returned from visiting [place], [place], [place], [place] or [place]; or traveled in the [place] to [place], [place], [place], or [place]; in the last two weeks or you have been in close contact with a person diagnosed with COVID-19 in the last 2 weeks.   . Tell the health care staff about your symptoms: fever, cough and shortness of breath. . After you have been seen by a medical provider, you will be either: o Tested for (COVID-19) and discharged home on quarantine except to seek medical care if symptoms worsen, and asked to  - Stay home and avoid contact with others until you get your results (4-5 days)  - Avoid travel on public transportation if possible (such as bus, train, or airplane) or o Sent to the Emergency Department by EMS for evaluation, COVID-19 testing, and possible  admission depending on your condition and test results.  What to do if you are LOW RISK for COVID-19?  Reduce your risk of any infection by using the same precautions used for avoiding the common cold or flu:  . Wash your hands often with soap and warm water for at least 20 seconds.  If soap and water are not readily available, use an alcohol-based hand sanitizer with at least 60% alcohol.  . If coughing or sneezing, cover your mouth and nose by coughing or sneezing into the elbow areas of your shirt or coat, into a tissue or into your sleeve (not your hands). . Avoid shaking hands with others and consider head nods or verbal greetings only. . Avoid touching your eyes, nose, or mouth with unwashed hands.  . Avoid close contact with people who are sick. . Avoid places or events with large numbers of people in one location, like concerts or sporting events. . Carefully consider travel plans you have or are making. . If you are planning any travel outside or inside the [place], visit the CDC's Travelers' Health webpage for the latest health notices. . If you have some symptoms but not all symptoms, continue to monitor at home and seek medical attention if your symptoms worsen. . If you are having a medical emergency, call 911.   ADDITIONAL HEALTHCARE OPTIONS FOR PATIENTS  [facility] Tele[facility] / e-Visit: [email or web address]         [facility]: [phone]  [facility]: [phone]                   [facility]: [phone]   Prescreened- neg. cm  

## 2018-05-08 NOTE — Telephone Encounter (Signed)
Coronavirus (COVID-19) Are you at risk?  Are you at risk for the Coronavirus (COVID-19)?  To be considered HIGH RISK for Coronavirus (COVID-19), you have to meet the following criteria:  . Traveled to [place], [place], [place], [place] or [place]; or in the [place] to [place], [place], [place], or [place]; and have fever, cough, and shortness of breath within the last 2 weeks of travel OR . Been in close contact with a person diagnosed with COVID-19 within the last 2 weeks and have fever, cough, and shortness of breath . IF YOU DO NOT MEET THESE CRITERIA, YOU ARE CONSIDERED LOW RISK FOR COVID-19.  What to do if you are HIGH RISK for COVID-19?  . If you are having a medical emergency, call 911. . Seek medical care right away. Before you go to a doctor's office, urgent care or emergency department, call ahead and tell them about your recent travel, contact with someone diagnosed with COVID-19, and your symptoms. You should receive instructions from your physician's office regarding next steps of care.  . When you arrive at healthcare provider, tell the healthcare staff immediately you have returned from visiting [place], [place], [place], [place] or [place]; or traveled in the [place] to [place], [place], [place], or [place]; in the last two weeks or you have been in close contact with a person diagnosed with COVID-19 in the last 2 weeks.   . Tell the health care staff about your symptoms: fever, cough and shortness of breath. . After you have been seen by a medical provider, you will be either: o Tested for (COVID-19) and discharged home on quarantine except to seek medical care if symptoms worsen, and asked to  - Stay home and avoid contact with others until you get your results (4-5 days)  - Avoid travel on public transportation if possible (such as bus, train, or airplane) or o Sent to the Emergency Department by EMS for evaluation, COVID-19 testing, and possible  admission depending on your condition and test results.  What to do if you are LOW RISK for COVID-19?  Reduce your risk of any infection by using the same precautions used for avoiding the common cold or flu:  . Wash your hands often with soap and warm water for at least 20 seconds.  If soap and water are not readily available, use an alcohol-based hand sanitizer with at least 60% alcohol.  . If coughing or sneezing, cover your mouth and nose by coughing or sneezing into the elbow areas of your shirt or coat, into a tissue or into your sleeve (not your hands). . Avoid shaking hands with others and consider head nods or verbal greetings only. . Avoid touching your eyes, nose, or mouth with unwashed hands.  . Avoid close contact with people who are Alvira He[place]. . Avoid places or events with large numbers of people in one location, like concerts or sporting events. . Carefully consider travel plans you have or are making. . If you are planning any travel outside or inside the [place], visit the CDC's Travelers' Health webpage for the latest health notices. . If you have some symptoms but not all symptoms, continue to monitor at home and seek medical attention if your symptoms worsen. . If you are having a medical emergency, call 911.  05/08/18 SCREENING NEG SLS ADDITIONAL HEALTHCARE OPTIONS FOR PATIENTS  [facility] / e-Visit: [email or web address]         [facility]: [phone]    [facility]: [phone]                   [facility]: [phone]  

## 2018-05-09 ENCOUNTER — Encounter (INDEPENDENT_AMBULATORY_CARE_PROVIDER_SITE_OTHER): Payer: Medicaid Other | Admitting: Certified Nurse Midwife

## 2018-05-09 ENCOUNTER — Other Ambulatory Visit: Payer: Self-pay

## 2018-05-09 NOTE — Progress Notes (Signed)
Error-no visit

## 2018-05-23 ENCOUNTER — Ambulatory Visit (INDEPENDENT_AMBULATORY_CARE_PROVIDER_SITE_OTHER): Payer: Medicaid Other | Admitting: Certified Nurse Midwife

## 2018-05-23 ENCOUNTER — Other Ambulatory Visit: Payer: Self-pay

## 2018-05-23 ENCOUNTER — Encounter: Payer: Self-pay | Admitting: Certified Nurse Midwife

## 2018-05-23 ENCOUNTER — Ambulatory Visit: Payer: Medicaid Other | Admitting: Certified Nurse Midwife

## 2018-05-23 NOTE — Progress Notes (Signed)
Subjective:    Christy Schmidt is a 23 y.o. G44P2002  female who presents for a postpartum visit. She is 6 weeks postpartum following a spontaneous vaginal delivery at 37.5 gestational weeks. Anesthesia: epidural. I have fully reviewed the prenatal and intrapartum course. Postpartum course has been WNL. Baby's course has been WNL. Baby is feeding by bottle. Bleeding no bleeding. Bowel function is normal. Bladder function is normal. Patient is sexually active. Contraception method is Nexplanon. Postpartum depression screening: negative. Score 0.  Last pap 2019 and was normal.  The following portions of the patient's history were reviewed and updated as appropriate: allergies, current medications, past medical history, past surgical history and problem list.  Review of Systems Pertinent items are noted in HPI.   Vitals:   05/23/18 1046  BP: 115/74  Pulse: 88  Weight: 201 lb 4.8 oz (91.3 kg)  Height: 5\' 6"  (1.676 m)   No LMP recorded. Patient has had an implant.  Objective:   General:  alert, cooperative and no distress   Breasts:  deferred, no complaints  Lungs: clear to auscultation bilaterally  Heart:  regular rate and rhythm  Abdomen: soft, nontender   Vulva: normal  Vagina: normal vagina  Cervix:  closed  Corpus: Well-involuted  Adnexa:  Non-palpable  Rectal Exam: no hemorrhoids        Assessment:   Postpartum exam 6  wks s/p SVD Bottle feeding Depression screening Contraception counseling   Plan:  : Nexplanon Follow up in: 1 year for annual or earlier if needed  Doreene Burke, CNM

## 2018-05-23 NOTE — Patient Instructions (Signed)
Preventive Care 18-39 Years, Female Preventive care refers to lifestyle choices and visits with your health care provider that can promote health and wellness. What does preventive care include?   A yearly physical exam. This is also called an annual well check.  Dental exams once or twice a year.  Routine eye exams. Ask your health care provider how often you should have your eyes checked.  Personal lifestyle choices, including: ? Daily care of your teeth and gums. ? Regular physical activity. ? Eating a healthy diet. ? Avoiding tobacco and drug use. ? Limiting alcohol use. ? Practicing safe sex. ? Taking vitamin and mineral supplements as recommended by your health care provider. What happens during an annual well check? The services and screenings done by your health care provider during your annual well check will depend on your age, overall health, lifestyle risk factors, and family history of disease. Counseling Your health care provider may ask you questions about your:  Alcohol use.  Tobacco use.  Drug use.  Emotional well-being.  Home and relationship well-being.  Sexual activity.  Eating habits.  Work and work environment.  Method of birth control.  Menstrual cycle.  Pregnancy history. Screening You may have the following tests or measurements:  Height, weight, and BMI.  Diabetes screening. This is done by checking your blood sugar (glucose) after you have not eaten for a while (fasting).  Blood pressure.  Lipid and cholesterol levels. These may be checked every 5 years starting at age 20.  Skin check.  Hepatitis C blood test.  Hepatitis B blood test.  Sexually transmitted disease (STD) testing.  BRCA-related cancer screening. This may be done if you have a family history of breast, ovarian, tubal, or peritoneal cancers.  Pelvic exam and Pap test. This may be done every 3 years starting at age 21. Starting at age 30, this may be done every 5  years if you have a Pap test in combination with an HPV test. Discuss your test results, treatment options, and if necessary, the need for more tests with your health care provider. Vaccines Your health care provider may recommend certain vaccines, such as:  Influenza vaccine. This is recommended every year.  Tetanus, diphtheria, and acellular pertussis (Tdap, Td) vaccine. You may need a Td booster every 10 years.  Varicella vaccine. You may need this if you have not been vaccinated.  HPV vaccine. If you are 26 or younger, you may need three doses over 6 months.  Measles, mumps, and rubella (MMR) vaccine. You may need at least one dose of MMR. You may also need a second dose.  Pneumococcal 13-valent conjugate (PCV13) vaccine. You may need this if you have certain conditions and were not previously vaccinated.  Pneumococcal polysaccharide (PPSV23) vaccine. You may need one or two doses if you smoke cigarettes or if you have certain conditions.  Meningococcal vaccine. One dose is recommended if you are age 19-21 years and a first-year college student living in a residence hall, or if you have one of several medical conditions. You may also need additional booster doses.  Hepatitis A vaccine. You may need this if you have certain conditions or if you travel or work in places where you may be exposed to hepatitis A.  Hepatitis B vaccine. You may need this if you have certain conditions or if you travel or work in places where you may be exposed to hepatitis B.  Haemophilus influenzae type b (Hib) vaccine. You may need this if you   have certain risk factors. Talk to your health care provider about which screenings and vaccines you need and how often you need them. This information is not intended to replace advice given to you by your health care provider. Make sure you discuss any questions you have with your health care provider. Document Released: 03/06/2001 Document Revised: 08/21/2016  Document Reviewed: 11/09/2014 Elsevier Interactive Patient Education  2019 [employer].

## 2018-05-27 NOTE — Progress Notes (Signed)
error 

## 2018-06-12 ENCOUNTER — Encounter: Payer: Medicaid Other | Admitting: Certified Nurse Midwife

## 2018-09-11 ENCOUNTER — Ambulatory Visit: Payer: Medicaid Other | Admitting: Certified Nurse Midwife

## 2018-09-16 ENCOUNTER — Ambulatory Visit: Payer: Medicaid Other | Admitting: Certified Nurse Midwife

## 2018-12-01 ENCOUNTER — Ambulatory Visit: Payer: Medicaid Other | Admitting: Certified Nurse Midwife

## 2018-12-05 ENCOUNTER — Ambulatory Visit: Payer: Medicaid Other | Admitting: Certified Nurse Midwife

## 2019-03-01 DIAGNOSIS — B349 Viral infection, unspecified: Secondary | ICD-10-CM | POA: Diagnosis not present

## 2019-03-01 DIAGNOSIS — Z20822 Contact with and (suspected) exposure to covid-19: Secondary | ICD-10-CM | POA: Diagnosis not present

## 2020-04-06 ENCOUNTER — Encounter: Payer: Self-pay | Admitting: Certified Nurse Midwife

## 2020-04-06 ENCOUNTER — Other Ambulatory Visit (HOSPITAL_COMMUNITY)
Admission: RE | Admit: 2020-04-06 | Discharge: 2020-04-06 | Disposition: A | Discharge status: Home / Self Care | Payer: Medicaid Other | Source: Ambulatory Visit | Attending: Certified Nurse Midwife | Admitting: Certified Nurse Midwife

## 2020-04-06 ENCOUNTER — Other Ambulatory Visit: Payer: Self-pay

## 2020-04-06 ENCOUNTER — Ambulatory Visit (INDEPENDENT_AMBULATORY_CARE_PROVIDER_SITE_OTHER): Payer: Medicaid Other | Admitting: Certified Nurse Midwife

## 2020-04-06 VITALS — BP 118/75 | HR 91 | Ht 66.0 in | Wt 204.0 lb

## 2020-04-06 DIAGNOSIS — N898 Other specified noninflammatory disorders of vagina: Secondary | ICD-10-CM | POA: Insufficient documentation

## 2020-04-06 MED ORDER — MEDROXYPROGESTERONE ACETATE 150 MG/ML IM SUSP: 150.0000 mg | Freq: Once | INTRAMUSCULAR | 3 refills | Status: DC

## 2020-04-06 NOTE — Patient Instructions (Signed)
Vaginitis  Vaginitis is a condition in which the vaginal tissue swells and becomes irritated. This condition is most often caused by a change in the normal balance of bacteria and yeast that live in the vagina. This change causes an overgrowth of certain bacteria or yeast, which causes the inflammation. There are different types of vaginitis. What are the causes? The cause of this condition depends on the type of vaginitis. It can be caused by:  Bacteria (bacterial vaginosis).  Yeast, which is a fungus (candidiasis).  A parasite (trichomoniasis vaginitis).  A virus (viral vaginitis).  Low hormone levels (atrophic vaginitis). Low hormone levels can occur during pregnancy, breastfeeding, or after menopause.  Irritants, such as bubble baths, scented tampons, and feminine sprays (allergic vaginitis). Other factors can change the normal balance of the yeast and bacteria that live in the vagina. These include:  Antibiotic medicines.  Poor hygiene.  Diaphragms, vaginal sponges, spermicides, birth control pills, and intrauterine devices (IUDs).  Sex.  Infection.  Uncontrolled diabetes.  A weakened body defense system (immune system). What increases the risk? This condition is more likely to develop in women who:  Smoke or are exposed to secondhand smoke.  Use vaginal douches, scented tampons, or scented sanitary pads.  Wear tight-fitting pants or thong underwear.  Use oral birth control pills or an IUD.  Have sex without a condom or have multiple partners.  Have an STI.  Frequently use the spermicide nonoxynol-9.  Eat lots of foods high in sugar or who have uncontrolled diabetes.  Have low estrogen levels.  Have a weakened immune system from an immune disorder or medical treatment.  Are pregnant or breastfeeding. What are the signs or symptoms? Symptoms vary depending on the cause of the vaginitis. Common symptoms include:  Abnormal vaginal discharge. ? The  discharge is white, gray, or yellow with bacterial vaginosis. ? The discharge is thick, white, and cheesy with a yeast infection. ? The discharge is frothy and yellow or greenish with trichomoniasis.  A bad vaginal smell. The smell is fishy with bacterial vaginosis.  Vaginal itching, pain, or swelling.  Pain with sex.  Pain or burning when urinating. Sometimes there are no symptoms. How is this diagnosed? This condition is diagnosed based on your symptoms and medical history. A physical exam, including a pelvic exam, will also be done. You may also have other tests, including:  Tests to determine the pH level (acidity or alkalinity) of your vagina.  A whiff test to assess the odor that results when a sample of your vaginal discharge is mixed with a potassium hydroxide solution.  Tests of vaginal fluid. A sample will be examined under a microscope. How is this treated? Treatment varies depending on the type of vaginitis you have. Your treatment may include:  Antibiotic creams or pills to treat bacterial vaginosis and trichomoniasis.  Antifungal medicines, such as vaginal creams or suppositories, to treat a yeast infection.  Medicine to ease discomfort if you have viral vaginitis. Your sexual partner should also be treated.  Estrogen delivered in a cream, pill, suppository, or vaginal ring to treat atrophic vaginitis. If vaginal dryness occurs, lubricants and moisturizing creams may help. You may need to avoid scented soaps, sprays, or douches.  Stopping use of a product that is causing allergic vaginitis and then using a vaginal cream to treat the symptoms. Follow these instructions at home: Lifestyle  Keep your genital area clean and dry. Avoid soap, and only rinse the area with water.  Do not douche   or use tampons until your health care provider says it is okay. Use sanitary pads, if needed.  Do not have sex until your health care provider approves. When you can return to sex,  practice safe sex and use condoms.  Wipe from front to back. This avoids the spread of bacteria from the rectum to the vagina. General instructions  Take over-the-counter and prescription medicines only as told by your health care provider.  If you were prescribed an antibiotic medicine, take or use it as told by your health care provider. Do not stop taking or using the antibiotic even if you start to feel better.  Keep all follow-up visits. This is important. How is this prevented?  Use mild, unscented products. Do not use things that can irritate the vagina, such as fabric softeners. Avoid the following products if they are scented: ? Feminine sprays. ? Detergents. ? Tampons. ? Feminine hygiene products. ? Soaps or bubble baths.  Let air reach your genital area. To do this: ? Wear cotton underwear to reduce moisture buildup. ? Avoid wearing underwear while you sleep. ? Avoid wearing tight pants and underwear or nylons without a cotton panel. ? Avoid wearing thong underwear.  Take off any wet clothing, such as bathing suits, as soon as possible.  Practice safe sex and use condoms. Contact a health care provider if:  You have abdominal or pelvic pain.  You have a fever or chills.  You have symptoms that last for more than 2-3 days. Get help right away if:  You have a fever and your symptoms suddenly get worse. Summary  Vaginitis is a condition in which the vaginal tissue becomes inflamed.This condition is most often caused by a change in the normal balance of bacteria and yeast that live in the vagina.  Treatment varies depending on the type of vaginitis you have.  Do not douche, use tampons, or have sex until your health care provider approves. When you can return to sex, practice safe sex and use condoms. This information is not intended to replace advice given to you by your health care provider. Make sure you discuss any questions you have with your health care  provider. Document Revised: 07/09/2019 Document Reviewed: 07/09/2019 Elsevier Patient Education  2021 Elsevier Inc.  

## 2020-04-06 NOTE — Progress Notes (Signed)
GYN ENCOUNTER NOTE  Subjective:       Christy Schmidt is a 25 y.o. G88P2002 female is here for gynecologic evaluation of the following issues:  1. Increased yellowish vaginal discharge for the past several days. She denies itching, burning, and odor. She is sexually active and denies use of condoms. STD testing today with swab.     2. Birth control, state she want her nexplanon removed because she is gaining weight and it is affecting her mood.   Gynecologic History Patient's last menstrual period was 02/25/2020 (exact date). Contraception: Nexplanon Last Pap: unkown Last mammogram:n/a   The pregnancy intention screening data noted above was reviewed. Potential methods of contraception were discussed. The patient elected to proceed with Hormonal Implant.Want to change to depo injections. Has taken in the past. State she had no problems with it. Discussed possible weight gain. She state she did not gain weight with it in the past.     Obstetric History OB History  Gravida Para Term Preterm AB Living  2 2 2     2   SAB IAB Ectopic Multiple Live Births        0 2    # Outcome Date GA Lbr Len/2nd Weight Sex Delivery Anes PTL Lv  2 Term 04/08/18 [redacted]w[redacted]d 11:43 / 00:07 8 lb 11.3 oz (3.95 kg) F Vag-Spont EPI  LIV  1 Term 04/16/15 [redacted]w[redacted]d  8 lb 11 oz (3.941 kg)  Vag-Spont EPI  LIV     Complications: Shoulder Dystocia, PPH (postpartum hemorrhage)    Past Medical History:  Diagnosis Date  . [redacted]w[redacted]d)     Past Surgical History:  Procedure Laterality Date  . OTHER SURGICAL HISTORY Right 2010   Bone Removed on Right Hand 3rd Digit    Current Outpatient Medications on File Prior to Visit  Medication Sig Dispense Refill  . etonogestrel (NEXPLANON) 68 MG IMPL implant 1 each by Subdermal route once.     No current facility-administered medications on file prior to visit.    Allergies  Allergen Reactions  . Nickel Rash    Social History   Socioeconomic History  . Marital status:  Divorced    Spouse name: Not on file  . Number of children: Not on file  . Years of education: Not on file  . Highest education level: Not on file  Occupational History  . Not on file  Tobacco Use  . Smoking status: Former 2011  . Smokeless tobacco: Never Used  Vaping Use  . Vaping Use: Never used  Substance and Sexual Activity  . Alcohol use: No  . Drug use: No  . Sexual activity: Yes    Birth control/protection: None, Implant  Other Topics Concern  . Not on file  Social History Narrative  . Not on file   Social Determinants of Health   Financial Resource Strain: Not on file  Food Insecurity: Not on file  Transportation Needs: Not on file  Physical Activity: Not on file  Stress: Not on file  Social Connections: Not on file  Intimate Partner Violence: Not on file    Family History  Problem Relation Age of Onset  . Migraines Mother   . Depression Mother   . Migraines Maternal Grandmother   . Migraines Cousin     The following portions of the patient's history were reviewed and updated as appropriate: allergies, current medications, past family history, past medical history, past social history, past surgical history and problem list.  Review of Systems Review  of Systems - Negative except as mentioned in HPI Review of Systems - General ROS: negative for - chills, fatigue, fever, hot flashes, malaise or night sweats Hematological and Lymphatic ROS: negative for - bleeding problems or swollen lymph nodes Gastrointestinal ROS: negative for - abdominal pain, blood in stools, change in bowel habits and nausea/vomiting Musculoskeletal ROS: negative for - joint pain, muscle pain or muscular weakness Genito-Urinary ROS: negative for - change in menstrual cycle, dysmenorrhea, dyspareunia, dysuria, genital discharge, genital ulcers, hematuria, incontinence, irregular/heavy menses, nocturia or pelvic painjj  Objective:   BP 118/75   Pulse 91   Ht 5\' 6"  (1.676 m)   Wt 204  lb (92.5 kg)   LMP 02/25/2020 (Exact Date)   Breastfeeding No   BMI 32.93 kg/m  CONSTITUTIONAL: Well-developed, well-nourished female in no acute distress.  HENT:  Normocephalic, atraumatic.  NECK: Normal range of motion, supple, no masses.  Normal thyroid.  SKIN: Skin is warm and dry. No rash noted. Not diaphoretic. No erythema. No pallor. NEUROLGIC: Alert and oriented to person, place, and time. PSYCHIATRIC: Normal mood and affect. Normal behavior. Normal judgment and thought content. CARDIOVASCULAR:Not Examined RESPIRATORY: Not Examined BREASTS: Not Examined ABDOMEN: Soft, non distended; Non tender.  No Organomegaly. PELVIC:  External Genitalia: Normal  BUS: Normal  Vagina: Normal, thing yellow/greenish discharge, no odor  Cervix: Normal MUSCULOSKELETAL: Normal range of motion. No tenderness.  No cyanosis, clubbing, or edema.     Assessment:   1. Vaginal discharge      Plan:   Vaginal swab collected. Will follow up with results. Discussed BC options specifically the depo injections.  Reviewed all forms of birth control options available including abstinence; fertility period awareness methods; over the counter/barrier methods; hormonal contraceptive medication including pill, patch, ring, injection,contraceptive implant; hormonal and nonhormonal IUDs; permanent sterilization options including vasectomy and the various tubal sterilization modalities. Risks and benefits reviewed.  Questions were answered.  She will follow up for nexplanon removal . Depo injections order . She will bring with her for nexplanon removal so she can receive her first dose.   04/24/2020, CNM

## 2020-04-08 LAB — CERVICOVAGINAL ANCILLARY ONLY
Bacterial Vaginitis (gardnerella): NEGATIVE
Candida Glabrata: NEGATIVE
Candida Vaginitis: NEGATIVE
Chlamydia: NEGATIVE
Comment: NEGATIVE
Comment: NEGATIVE
Comment: NEGATIVE
Comment: NEGATIVE
Comment: NEGATIVE
Comment: NORMAL
Neisseria Gonorrhea: NEGATIVE
Trichomonas: POSITIVE — AB

## 2020-04-10 ENCOUNTER — Other Ambulatory Visit: Payer: Self-pay | Admitting: Certified Nurse Midwife

## 2020-04-10 MED ORDER — METRONIDAZOLE 500 MG PO TABS: 2000.0000 mg | ORAL_TABLET | Freq: Once | ORAL | 0 refills | Status: AC

## 2020-04-13 ENCOUNTER — Encounter: Payer: Medicaid Other | Admitting: Certified Nurse Midwife

## 2020-04-20 ENCOUNTER — Other Ambulatory Visit: Payer: Self-pay

## 2020-04-20 ENCOUNTER — Encounter: Payer: Self-pay | Admitting: Certified Nurse Midwife

## 2020-04-20 ENCOUNTER — Ambulatory Visit (INDEPENDENT_AMBULATORY_CARE_PROVIDER_SITE_OTHER): Payer: Medicaid Other | Admitting: Certified Nurse Midwife

## 2020-04-20 VITALS — BP 96/65 | HR 86 | Resp 16 | Ht 66.0 in | Wt 202.3 lb

## 2020-04-20 DIAGNOSIS — Z3046 Encounter for surveillance of implantable subdermal contraceptive: Secondary | ICD-10-CM

## 2020-04-20 DIAGNOSIS — Z30013 Encounter for initial prescription of injectable contraceptive: Secondary | ICD-10-CM

## 2020-04-20 MED ORDER — MEDROXYPROGESTERONE ACETATE 150 MG/ML IM SUSP
150.0000 mg | Freq: Once | INTRAMUSCULAR | Status: AC
  Administered 2020-04-20: 150 mg via INTRAMUSCULAR

## 2020-04-20 NOTE — Progress Notes (Signed)
Olympia A Menzel is a 25 y.o. year old G38P2002 African American female here for Nexplanon removal  She was given informed consent for removal of her Nexplanon. Her Nexplanon was placed 04/25/18, No LMP recorded. Patient has had an implant.    BP 96/65   Pulse 86   Resp 16   Ht 5\' 6"  (1.676 m)   Wt 202 lb 4.8 oz (91.8 kg)   SpO2 99%   BMI 32.65 kg/m  No LMP recorded. Patient has had an implant. No results found for this or any previous visit (from the past 24 hour(s)).   Appropriate time out taken. Nexplanon site identified.  Area prepped in usual sterile fashon. Two cc's of 2% lidocaine was used to anesthetize the area. A small stab incision was made right beside the implant on the distal portion.  The Nexplanon rod was grasped using hemostats and removed intact without difficulty.  Steri-strips and a pressure bandage was applied.  There was less than 3 cc blood loss. There were no complications.  The patient tolerated the procedure well.  She was instructed to keep the area clean and dry, remove pressure bandage in 24 hours, and keep insertion site covered with the steri-strips for 3-5 days. She plan on using depo provera injections for birth control.  First dose given today.   Follow-up PRN problems.  , CNM

## 2020-04-20 NOTE — Patient Instructions (Signed)
Nexplanon Instructions After removal   Keep bandage clean and dry for 24 hours   May use ice/Tylenol/Ibuprofen for soreness or pain   If you develop fever, drainage or increased warmth from incision site-contact office immediately   

## 2020-04-28 ENCOUNTER — Encounter: Payer: Self-pay | Admitting: Certified Nurse Midwife

## 2020-07-18 ENCOUNTER — Ambulatory Visit: Payer: Medicaid Other

## 2020-07-20 ENCOUNTER — Ambulatory Visit: Payer: Medicaid Other

## 2020-07-21 ENCOUNTER — Ambulatory Visit: Payer: Medicaid Other

## 2020-07-28 ENCOUNTER — Ambulatory Visit (INDEPENDENT_AMBULATORY_CARE_PROVIDER_SITE_OTHER): Payer: Medicaid Other

## 2020-07-28 ENCOUNTER — Other Ambulatory Visit: Payer: Self-pay

## 2020-07-28 DIAGNOSIS — Z30013 Encounter for initial prescription of injectable contraceptive: Secondary | ICD-10-CM

## 2020-07-28 MED ORDER — MEDROXYPROGESTERONE ACETATE 150 MG/ML IM SUSP: 150.0000 mg | Freq: Once | INTRAMUSCULAR | 0 refills | Status: AC

## 2020-07-28 MED ORDER — MEDROXYPROGESTERONE ACETATE 150 MG/ML IM SUSP
150.0000 mg | Freq: Once | INTRAMUSCULAR | Status: AC
  Administered 2020-07-28: 150 mg via INTRAMUSCULAR

## 2020-07-28 NOTE — Progress Notes (Signed)
Date last pap: pap due. Last Depo-Provera: 04/20/2020. UPT-neg Side Effects if any: none. Serum HCG indicated? N/a. Depo-Provera 150 mg IM given by: Billy Fischer, LPN. Next appointment due Sept 22-Oct 27, 2020.  Pt supplied depo injection.

## 2020-07-29 ENCOUNTER — Encounter: Payer: Self-pay | Admitting: Certified Nurse Midwife

## 2020-07-29 LAB — POCT URINE PREGNANCY: Preg Test, Ur: NEGATIVE

## 2020-08-04 ENCOUNTER — Encounter: Payer: Self-pay | Admitting: Certified Nurse Midwife

## 2020-10-18 ENCOUNTER — Encounter: Payer: Medicaid Other | Admitting: Certified Nurse Midwife

## 2020-10-18 ENCOUNTER — Ambulatory Visit: Payer: Medicaid Other

## 2020-10-18 DIAGNOSIS — Z3042 Encounter for surveillance of injectable contraceptive: Secondary | ICD-10-CM

## 2020-10-20 NOTE — Progress Notes (Deleted)
Date last pap: 04/08/2018. Last Depo-Provera: 07/28/2020. Side Effects if any: None. Serum HCG indicated? Not Indicated. Depo-Provera 150 mg IM given by: Nikolina Simerson B, CMA. Next appointment due: Dec. 16- Dec. 30

## 2020-10-21 ENCOUNTER — Ambulatory Visit: Payer: Medicaid Other | Admitting: Certified Nurse Midwife

## 2020-10-21 DIAGNOSIS — Z3042 Encounter for surveillance of injectable contraceptive: Secondary | ICD-10-CM

## 2020-10-25 ENCOUNTER — Telehealth: Payer: Self-pay | Admitting: Certified Nurse Midwife

## 2020-10-25 NOTE — Telephone Encounter (Signed)
Pt called asking for her depo injection to be sent into her pharmacy- [facility] on [address]. I scheduled her injection for this week Oct 6.

## 2020-10-25 NOTE — Telephone Encounter (Signed)
LVM with patient instructing to call back in regards to depo refill. Patient must be seen in office to get refill due to frequent cancellations/no shows. AT willing to send in OCP til pt can be seen.

## 2020-10-27 ENCOUNTER — Ambulatory Visit: Payer: Medicaid Other

## 2020-10-27 ENCOUNTER — Encounter: Payer: Self-pay | Admitting: Certified Nurse Midwife

## 2020-10-27 ENCOUNTER — Telehealth: Payer: Self-pay | Admitting: Certified Nurse Midwife

## 2020-10-27 NOTE — Telephone Encounter (Signed)
Pt called at 3:40 pm, appoint is scheduled for 4:00 pm. Pt was asking if her Rx was at the pharmacy for depo injection. I made her aware that nurse had attempted to reach out and left message asking her to call office. I read message left in Mychart. I explained to patient that due to her multiple cancellations and no shows per policy she has been dismissed from our practice and we can provide medical care for urgent conditions for the next 30 days but she would need to seek a new provider for gynecology. Patient voiced understanding and said good bye.

## 2020-11-01 ENCOUNTER — Ambulatory Visit (LOCAL_COMMUNITY_HEALTH_CENTER): Payer: Medicaid Other | Admitting: Advanced Practice Midwife

## 2020-11-01 ENCOUNTER — Other Ambulatory Visit: Payer: Self-pay

## 2020-11-01 ENCOUNTER — Encounter: Payer: Self-pay | Admitting: Advanced Practice Midwife

## 2020-11-01 VITALS — BP 119/80 | Ht 66.0 in | Wt 223.8 lb

## 2020-11-01 DIAGNOSIS — Z0389 Encounter for observation for other suspected diseases and conditions ruled out: Secondary | ICD-10-CM | POA: Diagnosis not present

## 2020-11-01 DIAGNOSIS — Z3009 Encounter for other general counseling and advice on contraception: Secondary | ICD-10-CM

## 2020-11-01 DIAGNOSIS — E669 Obesity, unspecified: Secondary | ICD-10-CM | POA: Insufficient documentation

## 2020-11-01 DIAGNOSIS — Z30013 Encounter for initial prescription of injectable contraceptive: Secondary | ICD-10-CM | POA: Diagnosis not present

## 2020-11-01 LAB — WET PREP FOR TRICH, YEAST, CLUE
Trichomonas Exam: NEGATIVE
Yeast Exam: NEGATIVE

## 2020-11-01 MED ORDER — MEDROXYPROGESTERONE ACETATE 150 MG/ML IM SUSP
150.0000 mg | INTRAMUSCULAR | Status: AC
  Administered 2020-11-01 – 2021-02-16 (×2): 150 mg via INTRAMUSCULAR

## 2020-11-01 NOTE — Progress Notes (Signed)
Pt here for PE and Depo.  Wet mount result reviewed, no treatment required.  Depo 150 mg given IM without any complications.  Reminder card given for pt to return in 11-13 weeks for next Depo.  Pt stated that she needed to leave and was unable to complete blood work, that had been ordered. Berdie Ogren, RN

## 2020-11-01 NOTE — Progress Notes (Signed)
[facility] DEPARTMENT [facility] [address] Main Number: [phone]    Family Planning Visit- Initial Visit  Subjective:  Christy Schmidt is a 25 y.o. SBF exsmoker G2P2002 (5,2)  being seen today for an initial annual visit and to discuss contraceptive options.  The patient is currently using Depo Provera for pregnancy prevention. Patient reports she does not want a pregnancy in the next year.  Patient has the following medical conditions has Migraine without aura and without status migrainosus, not intractable; History of blood transfusion; History of postpartum hemorrhage; and Obesity BMI=36.1 on their problem list.  Chief Complaint  Patient presents with   Contraception    Depo and PE    Patient reports here for DMPA and physical. Last DMPA 07/30/20 (13 3/7). Last pap 11/19/17 neg. LMP 2021. Nexplanon placed 04/24/20 and removed 04/20/20. Last sex 09/20/20 with condom; with current partner x 8 years; 1 partner in last 3 mo. Not working, not in school. Last dental exam 03/2020.  Patient denies any problems  Body mass index is 36.12 kg/m. - Patient is eligible for diabetes screening based on BMI and age >57?  not applicable HA1C ordered? not applicable  Patient reports 1  partner/s in last year. Desires STI screening?  Yes  Has patient been screened once for HCV in the past?  No  No results found for: HCVAB  Does the patient have current drug use (including MJ), have a partner with drug use, and/or has been incarcerated since last result? No  If yes-- Screen for HCV through [facility]   Does the patient meet criteria for HBV testing? No  Criteria:  -Household, sexual or needle sharing contact with HBV -History of drug use -HIV positive -Those with known Hep C   Health Maintenance Due  Topic Date Due   COVID-19 Vaccine (1) Never done   HPV VACCINES (1 - 2-dose series) Never done   Hepatitis C Screening  Never done   PAP-Cervical  Cytology Screening  Never done   PAP SMEAR-Modifier  Never done   INFLUENZA VACCINE  Never done    Review of Systems  All other systems reviewed and are negative.  The following portions of the patient's history were reviewed and updated as appropriate: allergies, current medications, past family history, past medical history, past social history, past surgical history and problem list. Problem list updated.   See flowsheet for other program required questions.  Objective:   Vitals:   11/01/20 0834  BP: 119/80  Weight: 223 lb 12.8 oz (101.5 kg)  Height: 5\' 6"  (1.676 m)    Physical Exam Constitutional:      Appearance: Normal appearance. She is obese.  HENT:     Head: Normocephalic and atraumatic.     Mouth/Throat:     Mouth: Mucous membranes are moist.     Comments: Last dental exam 03/2020 Eyes:     Conjunctiva/sclera: Conjunctivae normal.  Neck:     Thyroid: No thyroid mass, thyromegaly or thyroid tenderness.  Cardiovascular:     Rate and Rhythm: Normal rate and regular rhythm.  Pulmonary:     Effort: Pulmonary effort is normal.     Breath sounds: Normal breath sounds.  Chest:  Breasts:    Right: Normal.     Left: Normal.  Abdominal:     Palpations: Abdomen is soft.     Comments: Soft without masses or tenderness, increased adipose   Genitourinary:    General: Normal vulva.  Exam position: Lithotomy position.     Vagina: Vaginal discharge (white creamy leukorrhea, ph<4.5) present.     Cervix: Normal.     Uterus: Normal.      Adnexa: Right adnexa normal and left adnexa normal.     Rectum: Normal.     Comments: Pap done Musculoskeletal:        General: Normal range of motion.     Cervical back: Normal range of motion and neck supple.  Skin:    General: Skin is warm and dry.  Neurological:     Mental Status: She is alert.  Psychiatric:        Mood and Affect: Mood normal.      Assessment and Plan:  Christy Schmidt is a 25 y.o. female presenting to  the [facility] Department for an initial annual wellness/contraceptive visit  Contraception counseling: Reviewed all forms of birth control options in the tiered based approach. available including abstinence; over the counter/barrier methods; hormonal contraceptive medication including pill, patch, ring, injection,contraceptive implant, ECP; hormonal and nonhormonal IUDs; permanent sterilization options including vasectomy and the various tubal sterilization modalities. Risks, benefits, and typical effectiveness rates were reviewed.  Questions were answered.  Written information was also given to the patient to review.  Patient desires DMPA, this was prescribed for patient. She will follow up in  11-13 wks for surveillance.  She was told to call with any further questions, or with any concerns about this method of contraception.  Emphasized use of condoms 100% of the time for STI prevention.  Patient was offered ECP. ECP was not accepted by the patient. ECP counseling was not given - see RN documentation  1. Family planning Treat wet mount per standing orders Immunization nurse consult - WET PREP FOR TRICH, YEAST, CLUE - Chlamydia/Gonorrhea [place] [facility] - HIV [place] [facility] - Syphilis Serology, [place] [facility] - IGP, rfx Aptima HPV ASCU  2. Encounter for initial prescription of injectable contraceptive DMPA 150 mg IM q 11-13 wks x 1 year - medroxyPROGESTERone (DEPO-PROVERA) injection 150 mg  3. Obesity, unspecified classification, unspecified obesity type, unspecified whether serious comorbidity present      No follow-ups on file.  No future appointments.  Alberteen Spindle, CNM

## 2020-11-05 LAB — IGP, RFX APTIMA HPV ASCU: PAP Smear Comment: 0

## 2021-02-16 ENCOUNTER — Other Ambulatory Visit: Payer: Self-pay

## 2021-02-16 ENCOUNTER — Ambulatory Visit (LOCAL_COMMUNITY_HEALTH_CENTER): Payer: Medicaid Other

## 2021-02-16 VITALS — BP 120/83 | Ht 66.0 in | Wt 219.0 lb

## 2021-02-16 DIAGNOSIS — Z3009 Encounter for other general counseling and advice on contraception: Secondary | ICD-10-CM | POA: Diagnosis not present

## 2021-02-16 DIAGNOSIS — Z30013 Encounter for initial prescription of injectable contraceptive: Secondary | ICD-10-CM | POA: Diagnosis not present

## 2021-02-16 DIAGNOSIS — Z3042 Encounter for surveillance of injectable contraceptive: Secondary | ICD-10-CM

## 2021-02-16 NOTE — Progress Notes (Signed)
15 weeks 2 days since last depo. RN counseled pt to space depo injections  at 11-13 week intervals for optimal benefit. Pt in agreement. Questions answered and reports understanding. Depo given today per order by Ola Spurr, CNM dated 11/01/2020. Tolerated well R delt. Next depo due 05/04/2021, reminder given. Josie Saunders, RN

## 2021-02-23 DIAGNOSIS — Z20822 Contact with and (suspected) exposure to covid-19: Secondary | ICD-10-CM | POA: Diagnosis not present

## 2021-03-09 ENCOUNTER — Encounter: Payer: Self-pay | Admitting: Advanced Practice Midwife

## 2021-03-09 ENCOUNTER — Ambulatory Visit: Payer: Medicaid Other | Admitting: Advanced Practice Midwife

## 2021-03-09 ENCOUNTER — Other Ambulatory Visit: Payer: Self-pay

## 2021-03-09 DIAGNOSIS — Z113 Encounter for screening for infections with a predominantly sexual mode of transmission: Secondary | ICD-10-CM

## 2021-03-09 DIAGNOSIS — F325 Major depressive disorder, single episode, in full remission: Secondary | ICD-10-CM | POA: Insufficient documentation

## 2021-03-09 DIAGNOSIS — F17290 Nicotine dependence, other tobacco product, uncomplicated: Secondary | ICD-10-CM | POA: Insufficient documentation

## 2021-03-09 DIAGNOSIS — Z0389 Encounter for observation for other suspected diseases and conditions ruled out: Secondary | ICD-10-CM | POA: Diagnosis not present

## 2021-03-09 LAB — WET PREP FOR TRICH, YEAST, CLUE
Trichomonas Exam: NEGATIVE
Yeast Exam: NEGATIVE

## 2021-03-09 NOTE — Progress Notes (Signed)
Pt here for STD screening.  Wet mount results reviewed, no treatment required per SO.  [facility] information given to pt.  Condoms declined by pt.  Windle Guard, RN

## 2021-03-09 NOTE — Progress Notes (Signed)
[facility] Department  STI clinic/screening visit [place] [address] [phone]  Subjective:  Christy Schmidt is a 26 y.o.SBF G2P2 smoker female being seen today for an STI screening visit. The patient reports they do not have symptoms.  Patient reports that they do not desire a pregnancy in the next year.   They reported they are not interested in discussing contraception today.    Patient's last menstrual period was 02/06/2021 (exact date).   Patient has the following medical conditions:   Patient Active Problem List   Diagnosis Date Noted   Depression, major, single episode, complete remission ([place]) 03/09/2021   Cigar smoker 03/09/2021   Obesity BMI=36.1 11/01/2020   History of blood transfusion 01/08/2018   History of postpartum hemorrhage 01/08/2018   Migraine without aura and without status migrainosus, not intractable 05/22/2012    Chief Complaint  Patient presents with   SEXUALLY TRANSMITTED DISEASE    screening    HPI  Patient reports asymptomatic but had "one time fling" on 03/06/21 without condom; 1 sex partner in last 3 mo. LMP 02/06/21.  Last DMPA 02/16/21. Last cigar yesterday. Last ETOH yesterday (2 shots vodka) qo weekend.  Last cigar yesterday  Last HIV test per patient/review of record was 11/01/20 Patient reports last pap was 10/2020  Screening for MPX risk: Does the patient have an unexplained rash? No Is the patient MSM? No Does the patient endorse multiple sex partners or anonymous sex partners? No Did the patient have close or sexual contact with a person diagnosed with MPX? No Has the patient traveled outside the [place] where MPX is endemic? No Is there a high clinical suspicion for MPX-- evidenced by one of the following No  -Unlikely to be chickenpox  -Lymphadenopathy  -Rash that present in same phase of evolution on any given body part See flowsheet for further details and programmatic requirements.    The  following portions of the patient's history were reviewed and updated as appropriate: allergies, current medications, past medical history, past social history, past surgical history and problem list.  Objective:  There were no vitals filed for this visit.  Physical Exam Vitals and nursing note reviewed.  Constitutional:      Appearance: Normal appearance. She is obese.  HENT:     Head: Normocephalic and atraumatic.     Mouth/Throat:     Mouth: Mucous membranes are moist.     Pharynx: Oropharynx is clear. No oropharyngeal exudate or posterior oropharyngeal erythema.  Pulmonary:     Effort: Pulmonary effort is normal.  Abdominal:     General: Abdomen is flat.     Palpations: There is no mass.     Tenderness: There is no abdominal tenderness. There is no rebound.     Comments: Soft, poor tone, without masses or tenderness  Genitourinary:    General: Normal vulva.     Exam position: Lithotomy position.     Pubic Area: No rash or pubic lice.      Labia:        Right: No rash or lesion.        Left: No rash or lesion.      Vagina: Vaginal discharge present. No erythema, bleeding (moderate pink-red menses, malodorous, ph>4.5) or lesions.     Cervix: Normal.     Uterus: Normal.      Adnexa: Right adnexa normal and left adnexa normal.     Rectum: Normal.  Lymphadenopathy:  Head:     Right side of head: No preauricular or posterior auricular adenopathy.     Left side of head: No preauricular or posterior auricular adenopathy.     Cervical: No cervical adenopathy.     Right cervical: No superficial, deep or posterior cervical adenopathy.    Left cervical: No superficial, deep or posterior cervical adenopathy.     Upper Body:     Right upper body: No supraclavicular or axillary adenopathy.     Left upper body: No supraclavicular or axillary adenopathy.     Lower Body: No right inguinal adenopathy. No left inguinal adenopathy.  Skin:    General: Skin is warm and dry.      Findings: No rash.  Neurological:     Mental Status: She is alert and oriented to person, place, and time.     Assessment and Plan:  Christy Schmidt is a 26 y.o. female presenting to the [facility] Department for STI screening  1. Screening examination for venereal disease Treat wet mount per standing orders Immunization nurse consult Please give pt contact info for [facility], [place], YEAST, CLUE - Syphilis Serology, [place] [facility] - HIV [place] [facility] - Chlamydia/Gonorrhea [place] [facility]     Return if symptoms worsen or fail to improve.  No future appointments.  Herbie Saxon, CNM

## 2021-05-25 ENCOUNTER — Ambulatory Visit: Payer: Medicaid Other

## 2022-01-10 ENCOUNTER — Other Ambulatory Visit: Payer: Self-pay

## 2022-01-10 ENCOUNTER — Emergency Department
Admission: EM | Admit: 2022-01-10 | Discharge: 2022-01-10 | Discharge status: Against Medical Advice | Payer: Medicaid Other | Attending: Emergency Medicine | Admitting: Emergency Medicine

## 2022-01-10 DIAGNOSIS — M791 Myalgia, unspecified site: Secondary | ICD-10-CM | POA: Insufficient documentation

## 2022-01-10 DIAGNOSIS — R059 Cough, unspecified: Secondary | ICD-10-CM | POA: Insufficient documentation

## 2022-01-10 DIAGNOSIS — Z5321 Procedure and treatment not carried out due to patient leaving prior to being seen by health care provider: Secondary | ICD-10-CM | POA: Diagnosis not present

## 2022-01-10 DIAGNOSIS — Z1152 Encounter for screening for COVID-19: Secondary | ICD-10-CM | POA: Diagnosis not present

## 2022-01-10 DIAGNOSIS — R109 Unspecified abdominal pain: Secondary | ICD-10-CM | POA: Diagnosis not present

## 2022-01-10 LAB — COMPREHENSIVE METABOLIC PANEL
ALT: 51 U/L — ABNORMAL HIGH (ref 0–44)
AST: 31 U/L (ref 15–41)
Albumin: 3.7 g/dL (ref 3.5–5.0)
Alkaline Phosphatase: 49 U/L (ref 38–126)
Anion gap: 11 (ref 5–15)
BUN: <5 mg/dL — ABNORMAL LOW (ref 6–20)
CO2: 20 mmol/L — ABNORMAL LOW (ref 22–32)
Calcium: 9.1 mg/dL (ref 8.9–10.3)
Chloride: 102 mmol/L (ref 98–111)
Creatinine, Ser: 0.54 mg/dL (ref 0.44–1.00)
GFR, Estimated: >60 mL/min (ref >60)
Glucose, Bld: 95 mg/dL (ref 70–99)
Potassium: 3.5 mmol/L (ref 3.5–5.1)
Sodium: 133 mmol/L — ABNORMAL LOW (ref 135–145)
Total Bilirubin: 0.8 mg/dL (ref 0.3–1.2)
Total Protein: 7.7 g/dL (ref 6.5–8.1)

## 2022-01-10 LAB — URINALYSIS, ROUTINE W REFLEX MICROSCOPIC
Bacteria, UA: NONE SEEN
Bilirubin Urine: NEGATIVE
Glucose, UA: NEGATIVE mg/dL
Hgb urine dipstick: NEGATIVE
Ketones, ur: 80 mg/dL — AB
Nitrite: NEGATIVE
Protein, ur: 30 mg/dL — AB
Specific Gravity, Urine: 1.02 (ref 1.005–1.030)
pH: 6 (ref 5.0–8.0)

## 2022-01-10 LAB — LIPASE, BLOOD: Lipase: 45 U/L (ref 11–51)

## 2022-01-10 LAB — POC URINE PREG, ED: Preg Test, Ur: POSITIVE — AB

## 2022-01-10 LAB — RESP PANEL BY RT-PCR (RSV, FLU A&B, COVID)  RVPGX2
Influenza A by PCR: NEGATIVE
Influenza B by PCR: POSITIVE — AB
Resp Syncytial Virus by PCR: NEGATIVE
SARS Coronavirus 2 by RT PCR: NEGATIVE

## 2022-01-10 LAB — CBC
HCT: 35.7 % — ABNORMAL LOW (ref 36.0–46.0)
Hemoglobin: 12 g/dL (ref 12.0–15.0)
MCH: 29.4 pg (ref 26.0–34.0)
MCHC: 33.6 g/dL (ref 30.0–36.0)
MCV: 87.5 fL (ref 80.0–100.0)
Platelets: 241 10*3/uL (ref 150–400)
RBC: 4.08 MIL/uL (ref 3.87–5.11)
RDW: 13 % (ref 11.5–15.5)
WBC: 8.6 10*3/uL (ref 4.0–10.5)
nRBC: 0 % (ref 0.0–0.2)

## 2022-01-10 LAB — HCG, QUANTITATIVE, PREGNANCY: hCG, Beta Chain, Quant, S: 88349 m[IU]/mL — ABNORMAL HIGH (ref <5)

## 2022-01-10 MED ORDER — SODIUM CHLORIDE 0.9 % IV BOLUS: 1000.0000 mL | Freq: Once | INTRAVENOUS | Status: DC

## 2022-01-10 NOTE — ED Triage Notes (Signed)
Pt comes with c/o belly pain, cough and body aches. Pt states she is pregnant but not sure how far along.

## 2022-01-11 ENCOUNTER — Ambulatory Visit
Admission: EM | Admit: 2022-01-11 | Discharge: 2022-01-11 | Disposition: A | Discharge status: Home / Self Care | Payer: Medicaid Other | Attending: Emergency Medicine | Admitting: Emergency Medicine

## 2022-01-11 ENCOUNTER — Telehealth: Payer: Self-pay

## 2022-01-11 ENCOUNTER — Encounter: Payer: Self-pay | Admitting: Emergency Medicine

## 2022-01-11 DIAGNOSIS — Z3201 Encounter for pregnancy test, result positive: Secondary | ICD-10-CM

## 2022-01-11 DIAGNOSIS — J069 Acute upper respiratory infection, unspecified: Secondary | ICD-10-CM

## 2022-01-11 DIAGNOSIS — J101 Influenza due to other identified influenza virus with other respiratory manifestations: Secondary | ICD-10-CM

## 2022-01-11 MED ORDER — OSELTAMIVIR PHOSPHATE 75 MG PO CAPS: 75.0000 mg | ORAL_CAPSULE | Freq: Two times a day (BID) | ORAL | 0 refills | Status: AC

## 2022-01-11 MED ORDER — ONDANSETRON 8 MG PO TBDP: 8.0000 mg | ORAL_TABLET | Freq: Three times a day (TID) | ORAL | 0 refills | Status: DC | PRN

## 2022-01-11 NOTE — ED Provider Notes (Signed)
[facility]    CSN: [id number] Arrival date & time: 01/11/22  0846      History   Chief Complaint Chief Complaint  Patient presents with   Emesis   Chills   Fatigue   Cough   Nasal Congestion    HPI Christy Schmidt is a 26 y.o. female.   HPI  26 year old female here for evaluation of respiratory complaints.  Patient reports her symptoms began yesterday and they consist of runny nose, cough, body aches, nausea, and vomiting.  She did go to the ER yesterday where she had pulmonary blood work, urine, and respiratory testing performed.  She left before being seen by a physician.  Review of the labs shows that the patient is pregnant and also has influenza B.  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 Authorizing Provider  ondansetron (ZOFRAN-ODT) 8 MG disintegrating tablet Take 1 tablet (8 mg total) by mouth every 8 (eight) hours as needed for nausea or vomiting. 01/11/22  Yes Becky Augusta, NP  oseltamivir (TAMIFLU) 75 MG capsule Take 1 capsule (75 mg total) by mouth every 12 (twelve) hours. 01/11/22  Yes Becky Augusta, NP  medroxyPROGESTERone (DEPO-PROVERA) 150 MG/ML injection Inject  1 mL (150 mg total) into the muscle once for 1 dose. 07/28/20 07/28/20  Doreene Burke, CNM    Family History Family History  Problem Relation Age of Onset   Migraines Mother    Depression Mother    Migraines Maternal Grandmother    Migraines Cousin     Social History Social History   Tobacco Use   Smoking status: Former    Types: Cigarettes   Smokeless tobacco: Never  Vaping Use   Vaping Use: Never used  Substance Use Topics   Alcohol use: Yes    Alcohol/week: 2.0 standard drinks of alcohol    Types: 2 Shots of liquor per week    Comment: last use 03/08/21 qo weekend   Drug use: No     Allergies   Nickel   Review of Systems Review of Systems  HENT:  Positive for congestion and rhinorrhea. Negative for sore throat.   Respiratory:  Positive for cough.   Gastrointestinal:  Positive for nausea and vomiting.     Physical Exam Triage Vital Signs ED Triage Vitals [01/11/22 1020]  Enc Vitals Group     BP 120/80     Pulse Rate (!) 123  Resp 18     Temp 99.2 F (37.3 C)     Temp Source Oral     SpO2 100 %     Weight      Height      Head Circumference      Peak Flow      Pain Score      Pain Loc      Pain Edu?      Excl. in GC?    No data found.  Updated Vital Signs BP 120/80 (BP Location: Left Arm)   Pulse (!) 123   Temp 99.2 F (37.3 C) (Oral)   Resp 18   LMP 02/06/2021 (Exact Date)   SpO2 100%   Visual Acuity Right Eye Distance:   Left Eye Distance:   Bilateral Distance:    Right Eye Near:   Left Eye Near:    Bilateral Near:     Physical Exam Vitals and nursing note reviewed.  Constitutional:      Appearance: Normal appearance. She is not ill-appearing.  HENT:     Head: Normocephalic and atraumatic.     Right Ear: Tympanic membrane, ear canal and external ear normal. There is no impacted cerumen.     Left Ear: Tympanic membrane, ear canal and external ear normal. There is no impacted cerumen.     Nose: Congestion present. No  rhinorrhea.     Comments: Nasal mucosa is erythematous and edematous.    Mouth/Throat:     Mouth: Mucous membranes are moist.     Pharynx: Posterior oropharyngeal erythema present. No oropharyngeal exudate.     Comments: Posterior pharynx does demonstrate mild erythema and clear postnasal drip. Cardiovascular:     Rate and Rhythm: Normal rate and regular rhythm.     Pulses: Normal pulses.     Heart sounds: Normal heart sounds. No murmur heard.    No friction rub. No gallop.  Skin:    General: Skin is warm and dry.     Capillary Refill: Capillary refill takes less than 2 seconds.     Findings: No erythema or rash.  Neurological:     General: No focal deficit present.     Mental Status: She is alert and oriented to person, place, and time.  Psychiatric:        Mood and Affect: Mood normal.        Behavior: Behavior normal.        Thought Content: Thought content normal.        Judgment: Judgment normal.      UC Treatments / Results  Labs (all labs ordered are listed, but only abnormal results are displayed) Labs Reviewed - No data to display  EKG   Radiology No results found.  Procedures Procedures (including critical care time)  Medications Ordered in UC Medications - No data to display  Initial Impression / Assessment and Plan / UC Course  I have reviewed the triage vital signs and the nursing notes.  Pertinent labs & imaging results that were available during my care of the patient were reviewed by me and considered in my medical decision making (see chart for details).   Patient is a pleasant, nontoxic-appearing 26 year old female here for evaluation of respiratory complaints as outlined HPI above.  Also as mentioned above she tested positive for influenza B yesterday in the ER though she did not stay to be seen by a physician.  She is not in any acute distress at present and she is not  experiencing any nausea or vomiting.  She does have an appointment with the  health department in 2 weeks and she is aware that she is pregnant.  Given that she has influenza B and there is increased risk of respiratory complications in pregnancy we discussed the use of Tamiflu.  I advised the patient that it is recommended to treat with Tamiflu though we do not have any good studies to evaluate full safety of the medication in pregnancy.  We also discussed that she is at increased risk for respiratory complications which could include respiratory collapse, hospitalization, and death.  After discussion patient is in agreement to take the Tamiflu.  I will also prescribe Zofran that she can use every 8 hours as needed for nausea and vomiting.  She can use over-the-counter Tylenol for body aches and fever and plain over-the-counter Robitussin as needed for cough.  Return precautions reviewed.   Final Clinical Impressions(s) / UC Diagnoses   Final diagnoses:  Influenza B  Viral URI with cough  Positive pregnancy test     Discharge Instructions      Take the Tamiflu twice daily for 5 days for treatment of influenza.  Use the Zofran every 8 hours as needed for nausea and vomiting  Use over-the-counter Robitussin during the day as needed for cough.  Use OTC Tylenol as needed for pain and fever.  Return for reevaluation, or see your primary care provider, for new or worsening symptoms.      ED Prescriptions     Medication Sig Dispense Auth. Provider   oseltamivir (TAMIFLU) 75 MG capsule Take 1 capsule (75 mg total) by mouth every 12 (twelve) hours. 10 capsule Becky Augusta, NP   ondansetron (ZOFRAN-ODT) 8 MG disintegrating tablet Take 1 tablet (8 mg total) by mouth every 8 (eight) hours as needed for nausea or vomiting. 20 tablet Becky Augusta, NP      PDMP not reviewed this encounter.   Becky Augusta, NP 01/11/22 1057

## 2022-01-11 NOTE — Discharge Instructions (Signed)
Take the Tamiflu twice daily for 5 days for treatment of influenza.  Use the Zofran every 8 hours as needed for nausea and vomiting  Use over-the-counter Robitussin during the day as needed for cough.  Use OTC Tylenol as needed for pain and fever.  Return for reevaluation, or see your primary care provider, for new or worsening symptoms.

## 2022-01-11 NOTE — Patient Outreach (Signed)
Transition Care Management Unsuccessful Follow-up Telephone Call  Date of discharge and from where:  12/20 and 01/11/22  Attempts:  1st Attempt  Reason for unsuccessful TCM follow-up call:  Left voice message   Gus Puma, BSW, [facility]  [facility]  High Risk Managed Medicaid Team  [phone]

## 2022-01-11 NOTE — ED Triage Notes (Signed)
Pt presents with cough, runny nose, vomiting and bodyaches. Pt went to the ED yesterday and had to leave. Her labs show positive Flu B.

## 2022-01-16 ENCOUNTER — Encounter: Payer: Self-pay | Admitting: Emergency Medicine

## 2022-01-16 ENCOUNTER — Ambulatory Visit
Admission: EM | Admit: 2022-01-16 | Discharge: 2022-01-16 | Disposition: A | Discharge status: Home / Self Care | Payer: Medicaid Other | Attending: Emergency Medicine | Admitting: Emergency Medicine

## 2022-01-16 DIAGNOSIS — J40 Bronchitis, not specified as acute or chronic: Secondary | ICD-10-CM

## 2022-01-16 DIAGNOSIS — J069 Acute upper respiratory infection, unspecified: Secondary | ICD-10-CM | POA: Diagnosis not present

## 2022-01-16 MED ORDER — CEFDINIR 300 MG PO CAPS: 300.0000 mg | ORAL_CAPSULE | Freq: Two times a day (BID) | ORAL | 0 refills | Status: AC

## 2022-01-16 MED ORDER — ALBUTEROL SULFATE HFA 108 (90 BASE) MCG/ACT IN AERS: 2.0000 | INHALATION_SPRAY | RESPIRATORY_TRACT | 0 refills | Status: AC | PRN

## 2022-01-16 MED ORDER — AEROCHAMBER MV MISC: 2 refills | Status: AC

## 2022-01-16 MED ORDER — ONDANSETRON 8 MG PO TBDP: 8.0000 mg | ORAL_TABLET | Freq: Three times a day (TID) | ORAL | 0 refills | Status: AC | PRN

## 2022-01-16 NOTE — Discharge Instructions (Signed)
Use the albuterol inhaler/nebulizer every 4-6 hours as needed for shortness of breath, wheezing, and cough.  Take the cefdinir twice daily with food for 7 days for treatment of bronchitis.  Use Zofran every 8 hours as needed for nausea and vomiting.  Continue to use over-the-counter plain Robitussin to help with cough and congestion.  Use over-the-counter Tylenol according to the package instructions as needed for pain.  Return for reevaluation for new or worsening symptoms.

## 2022-01-16 NOTE — ED Provider Notes (Signed)
[facility]    CSN: [id number] Arrival date & time: 01/16/22  1531      History   Chief Complaint Chief Complaint  Patient presents with   Emesis   Cough   Sore Throat    HPI Christy Schmidt is a 26 y.o. female.   HPI  26 year old female here for reevaluation of respiratory complaints.  Patient was seen in this urgent care 5 days ago and diagnosed with influenza B.  She was discharged home on Tamiflu and Zofran to help with nausea vomiting.  She states she is continue to have runny nose, nasal congestion, cough that is intermittently productive for mucus, vomiting, and now she is experiencing chest pain that is reproducible to palpation and is worse with coughing and deep breathing.  Nuys fever.  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 Authorizing Provider  albuterol (VENTOLIN HFA) 108 (90 Base) MCG/ACT inhaler Inhale 2 puffs into the lungs every 4 (four) hours as needed. 01/16/22  Yes Becky Augusta, NP  cefdinir (OMNICEF) 300 MG capsule Take 1 capsule (300 mg total) by mouth 2 (two) times daily for 7 days. 01/16/22 01/23/22 Yes Becky Augusta, NP  ondansetron (ZOFRAN-ODT) 8 MG  disintegrating tablet Take 1 tablet (8 mg total) by mouth every 8 (eight) hours as needed for nausea or vomiting. 01/16/22  Yes Becky Augusta, NP  Spacer/Aero-Holding Chambers (AEROCHAMBER MV) inhaler Use as instructed 01/16/22  Yes Becky Augusta, NP  medroxyPROGESTERone (DEPO-PROVERA) 150 MG/ML injection Inject 1 mL (150 mg total) into the muscle once for 1 dose. 07/28/20 07/28/20  Doreene Burke, CNM  oseltamivir (TAMIFLU) 75 MG capsule Take 1 capsule (75 mg total) by mouth every 12 (twelve) hours. 01/11/22   Becky Augusta, NP    Family History Family History  Problem Relation Age of Onset   Migraines Mother    Depression Mother    Migraines Maternal Grandmother    Migraines Cousin     Social History Social History   Tobacco Use   Smoking status: Former    Types: Cigarettes   Smokeless tobacco: Never  Vaping Use   Vaping Use: Never used  Substance Use Topics   Alcohol use: Yes    Alcohol/week: 2.0 standard drinks of alcohol    Types: 2 Shots of liquor per week    Comment: last use 03/08/21 qo weekend   Drug use: No  Allergies   Nickel   Review of Systems Review of Systems  Constitutional:  Negative for fever.  HENT:  Positive for congestion and rhinorrhea.   Respiratory:  Positive for cough. Negative for wheezing.   Gastrointestinal:  Positive for nausea and vomiting.     Physical Exam Triage Vital Signs ED Triage Vitals  Enc Vitals Group     BP 01/16/22 1714 117/71     Pulse Rate 01/16/22 1714 90     Resp 01/16/22 1714 18     Temp 01/16/22 1714 98.5 F (36.9 C)     Temp Source 01/16/22 1714 Oral     SpO2 01/16/22 1714 99 %     Weight --      Height --      Head Circumference --      Peak Flow --      Pain Score 01/16/22 1712 9     Pain Loc --      Pain Edu? --      Excl. in GC? --    No data found.  Updated Vital Signs BP 117/71 (BP Location: Right Arm)   Pulse 90   Temp 98.5 F (36.9 C) (Oral)   Resp 18   LMP 02/06/2021 (Exact Date)   SpO2  99%   Visual Acuity Right Eye Distance:   Left Eye Distance:   Bilateral Distance:    Right Eye Near:   Left Eye Near:    Bilateral Near:     Physical Exam Vitals and nursing note reviewed.  Constitutional:      Appearance: Normal appearance. She is not ill-appearing.  HENT:     Head: Normocephalic and atraumatic.     Right Ear: Ear canal and external ear normal. There is no impacted cerumen.     Left Ear: Ear canal and external ear normal. There is no impacted cerumen.     Ears:     Comments: Both tympanic membranes are erythematous and injected.  Both EACs are clear.    Nose: Congestion and rhinorrhea present.     Comments: Nasal mucosa is erythematous and edematous with clear discharge in both nares.    Mouth/Throat:     Mouth: Mucous membranes are moist.     Pharynx: Oropharynx is clear. Posterior oropharyngeal erythema present.     Comments: Posterior oropharynx has mild erythema without injection.  Clear postnasal drip is present. Cardiovascular:     Rate and Rhythm: Normal rate and regular rhythm.     Pulses: Normal pulses.     Heart sounds: Normal heart sounds. No murmur heard.    No friction rub. No gallop.  Pulmonary:     Breath sounds: Rhonchi present. No wheezing or rales.  Chest:     Chest wall: Tenderness present.  Musculoskeletal:     Cervical back: Normal range of motion and neck supple.  Lymphadenopathy:     Cervical: No cervical adenopathy.  Skin:    General: Skin is warm and dry.     Capillary Refill: Capillary refill takes less than 2 seconds.  Neurological:     General: No focal deficit present.     Mental Status: She is alert and oriented to person, place, and time.  Psychiatric:        Mood and Affect: Mood normal.        Behavior: Behavior normal.        Thought Content: Thought content normal.        Judgment: Judgment normal.  UC Treatments / Results  Labs (all labs ordered are listed, but only abnormal results are  displayed) Labs Reviewed - No data to display  EKG   Radiology No results found.  Procedures Procedures (including critical care time)  Medications Ordered in UC Medications - No data to display  Initial Impression / Assessment and Plan / UC Course  I have reviewed the triage vital signs and the nursing notes.  Pertinent labs & imaging results that were available during my care of the patient were reviewed by me and considered in my medical decision making (see chart for details).   Patient is nontoxic-appearing 26 year old female who was recently diagnosed with influenza B and also was found to be pregnant at her last visit 5 days ago.  She took her 5 days of Tamiflu but states that she has continued to feel worse.  She did not pick up the Zofran that was prescribed for her at the pharmacy and is continued to have nausea and vomiting.  She is complaining of pain in her chest that is reproducible to palpation and also increases with coughing and deep breathing.  Her exam shows inflammation of her upper respiratory tract and she also has erythema and injection of both tympanic membranes which was not present previously.  She does have a reproducible chest pain as mentioned above.  Her lung sounds have coarse rhonchi in all fields.  I am concerned that the patient is developed bronchitis or pneumonia as a result of her influenza infection.  Due to the fact the patient is pregnant I will treat her with cefdinir 300 mg twice daily for 10 days for treatment of potential bacterial sources for the otitis.  I will also prescribe an albuterol inhaler that she can use every 4-6 hours as needed for shortness of breath or wheezing.   Final Clinical Impressions(s) / UC Diagnoses   Final diagnoses:  Upper respiratory tract infection, unspecified type  Bronchitis     Discharge Instructions      Use the albuterol inhaler/nebulizer every 4-6 hours as needed for shortness of breath, wheezing, and  cough.  Take the cefdinir twice daily with food for 7 days for treatment of bronchitis.  Use Zofran every 8 hours as needed for nausea and vomiting.  Continue to use over-the-counter plain Robitussin to help with cough and congestion.  Use over-the-counter Tylenol according to the package instructions as needed for pain.  Return for reevaluation for new or worsening symptoms.      ED Prescriptions     Medication Sig Dispense Auth. Provider   cefdinir (OMNICEF) 300 MG capsule Take 1 capsule (300 mg total) by mouth 2 (two) times daily for 7 days. 14 capsule Becky Augusta, NP   albuterol (VENTOLIN HFA) 108 (90 Base) MCG/ACT inhaler Inhale 2 puffs into the lungs every 4 (four) hours as needed. 18 g Becky Augusta, NP   Spacer/Aero-Holding Chambers (AEROCHAMBER MV) inhaler Use as instructed 1 each Becky Augusta, NP   ondansetron (ZOFRAN-ODT) 8 MG disintegrating tablet Take 1 tablet (8 mg total) by mouth every 8 (eight) hours as needed for nausea or vomiting. 20 tablet Becky Augusta, NP      PDMP not reviewed this encounter.   Becky Augusta, NP 01/16/22 [phone]

## 2022-01-16 NOTE — ED Triage Notes (Signed)
Pt seen 01/11/22 dx with flu and is not better. She continues to have cough, runny nose, vomiting, and chest congestion.

## 2022-01-19 DIAGNOSIS — R519 Headache, unspecified: Secondary | ICD-10-CM | POA: Diagnosis not present

## 2022-01-19 DIAGNOSIS — Z3A12 12 weeks gestation of pregnancy: Secondary | ICD-10-CM | POA: Diagnosis not present

## 2022-01-26 DIAGNOSIS — R059 Cough, unspecified: Secondary | ICD-10-CM | POA: Diagnosis not present

## 2022-01-26 DIAGNOSIS — Z20822 Contact with and (suspected) exposure to covid-19: Secondary | ICD-10-CM | POA: Diagnosis not present

## 2022-01-26 DIAGNOSIS — J111 Influenza due to unidentified influenza virus with other respiratory manifestations: Secondary | ICD-10-CM | POA: Diagnosis not present

## 2022-02-06 DIAGNOSIS — N925 Other specified irregular menstruation: Secondary | ICD-10-CM | POA: Diagnosis not present

## 2022-03-06 DIAGNOSIS — Z3A22 22 weeks gestation of pregnancy: Secondary | ICD-10-CM | POA: Diagnosis not present

## 2022-03-06 DIAGNOSIS — Z3689 Encounter for other specified antenatal screening: Secondary | ICD-10-CM | POA: Diagnosis not present

## 2022-03-19 DIAGNOSIS — O0930 Supervision of pregnancy with insufficient antenatal care, unspecified trimester: Secondary | ICD-10-CM | POA: Diagnosis not present

## 2022-03-19 DIAGNOSIS — O09299 Supervision of pregnancy with other poor reproductive or obstetric history, unspecified trimester: Secondary | ICD-10-CM | POA: Diagnosis not present

## 2022-03-19 DIAGNOSIS — O219 Vomiting of pregnancy, unspecified: Secondary | ICD-10-CM | POA: Diagnosis not present

## 2022-03-19 DIAGNOSIS — F419 Anxiety disorder, unspecified: Secondary | ICD-10-CM | POA: Diagnosis not present

## 2022-04-27 DIAGNOSIS — Z3493 Encounter for supervision of normal pregnancy, unspecified, third trimester: Secondary | ICD-10-CM | POA: Diagnosis not present

## 2022-04-27 DIAGNOSIS — B349 Viral infection, unspecified: Secondary | ICD-10-CM | POA: Diagnosis not present

## 2022-05-24 DIAGNOSIS — O0930 Supervision of pregnancy with insufficient antenatal care, unspecified trimester: Secondary | ICD-10-CM | POA: Diagnosis not present

## 2022-05-24 DIAGNOSIS — O09299 Supervision of pregnancy with other poor reproductive or obstetric history, unspecified trimester: Secondary | ICD-10-CM | POA: Diagnosis not present

## 2022-05-29 DIAGNOSIS — Z3A34 34 weeks gestation of pregnancy: Secondary | ICD-10-CM | POA: Diagnosis not present

## 2022-05-29 DIAGNOSIS — O09293 Supervision of pregnancy with other poor reproductive or obstetric history, third trimester: Secondary | ICD-10-CM | POA: Diagnosis not present

## 2022-05-29 DIAGNOSIS — Z362 Encounter for other antenatal screening follow-up: Secondary | ICD-10-CM | POA: Diagnosis not present

## 2022-05-29 DIAGNOSIS — O0933 Supervision of pregnancy with insufficient antenatal care, third trimester: Secondary | ICD-10-CM | POA: Diagnosis not present

## 2022-06-13 DIAGNOSIS — R7309 Other abnormal glucose: Secondary | ICD-10-CM | POA: Diagnosis not present

## 2022-06-22 DIAGNOSIS — O0933 Supervision of pregnancy with insufficient antenatal care, third trimester: Secondary | ICD-10-CM | POA: Diagnosis not present

## 2022-07-03 DIAGNOSIS — Z3A39 39 weeks gestation of pregnancy: Secondary | ICD-10-CM | POA: Diagnosis not present

## 2022-07-03 DIAGNOSIS — O99344 Other mental disorders complicating childbirth: Secondary | ICD-10-CM | POA: Diagnosis not present

## 2022-07-03 DIAGNOSIS — F419 Anxiety disorder, unspecified: Secondary | ICD-10-CM | POA: Diagnosis not present

## 2022-07-03 DIAGNOSIS — O0993 Supervision of high risk pregnancy, unspecified, third trimester: Secondary | ICD-10-CM | POA: Diagnosis not present

## 2022-07-03 DIAGNOSIS — O134 Gestational [pregnancy-induced] hypertension without significant proteinuria, complicating childbirth: Secondary | ICD-10-CM | POA: Diagnosis not present

## 2022-07-03 DIAGNOSIS — Z7982 Long term (current) use of aspirin: Secondary | ICD-10-CM | POA: Diagnosis not present

## 2022-07-04 DIAGNOSIS — Z8759 Personal history of other complications of pregnancy, childbirth and the puerperium: Secondary | ICD-10-CM | POA: Diagnosis not present

## 2022-07-04 DIAGNOSIS — Z3A39 39 weeks gestation of pregnancy: Secondary | ICD-10-CM | POA: Diagnosis not present

## 2023-04-08 ENCOUNTER — Ambulatory Visit

## 2023-08-26 DIAGNOSIS — F419 Anxiety disorder, unspecified: Secondary | ICD-10-CM | POA: Diagnosis not present

## 2023-08-27 DIAGNOSIS — F419 Anxiety disorder, unspecified: Secondary | ICD-10-CM | POA: Diagnosis not present

## 2023-08-29 DIAGNOSIS — F419 Anxiety disorder, unspecified: Secondary | ICD-10-CM | POA: Diagnosis not present

## 2023-09-02 DIAGNOSIS — F419 Anxiety disorder, unspecified: Secondary | ICD-10-CM | POA: Diagnosis not present

## 2023-09-04 DIAGNOSIS — F419 Anxiety disorder, unspecified: Secondary | ICD-10-CM | POA: Diagnosis not present

## 2023-09-10 DIAGNOSIS — F419 Anxiety disorder, unspecified: Secondary | ICD-10-CM | POA: Diagnosis not present

## 2023-09-13 DIAGNOSIS — F419 Anxiety disorder, unspecified: Secondary | ICD-10-CM | POA: Diagnosis not present

## 2023-09-16 DIAGNOSIS — F419 Anxiety disorder, unspecified: Secondary | ICD-10-CM | POA: Diagnosis not present

## 2023-09-17 DIAGNOSIS — F419 Anxiety disorder, unspecified: Secondary | ICD-10-CM | POA: Diagnosis not present

## 2023-09-26 DIAGNOSIS — F419 Anxiety disorder, unspecified: Secondary | ICD-10-CM | POA: Diagnosis not present

## 2023-09-28 DIAGNOSIS — F419 Anxiety disorder, unspecified: Secondary | ICD-10-CM | POA: Diagnosis not present

## 2023-10-01 DIAGNOSIS — F419 Anxiety disorder, unspecified: Secondary | ICD-10-CM | POA: Diagnosis not present

## 2023-10-03 DIAGNOSIS — F419 Anxiety disorder, unspecified: Secondary | ICD-10-CM | POA: Diagnosis not present

## 2023-10-07 DIAGNOSIS — F419 Anxiety disorder, unspecified: Secondary | ICD-10-CM | POA: Diagnosis not present

## 2023-10-09 DIAGNOSIS — F419 Anxiety disorder, unspecified: Secondary | ICD-10-CM | POA: Diagnosis not present

## 2023-10-14 DIAGNOSIS — F419 Anxiety disorder, unspecified: Secondary | ICD-10-CM | POA: Diagnosis not present

## 2023-10-18 DIAGNOSIS — F419 Anxiety disorder, unspecified: Secondary | ICD-10-CM | POA: Diagnosis not present

## 2023-10-24 DIAGNOSIS — F419 Anxiety disorder, unspecified: Secondary | ICD-10-CM | POA: Diagnosis not present

## 2023-10-26 DIAGNOSIS — F419 Anxiety disorder, unspecified: Secondary | ICD-10-CM | POA: Diagnosis not present

## 2023-10-29 DIAGNOSIS — F419 Anxiety disorder, unspecified: Secondary | ICD-10-CM | POA: Diagnosis not present

## 2023-10-30 DIAGNOSIS — F419 Anxiety disorder, unspecified: Secondary | ICD-10-CM | POA: Diagnosis not present

## 2023-11-06 DIAGNOSIS — F419 Anxiety disorder, unspecified: Secondary | ICD-10-CM | POA: Diagnosis not present

## 2023-11-07 DIAGNOSIS — F419 Anxiety disorder, unspecified: Secondary | ICD-10-CM | POA: Diagnosis not present

## 2023-11-14 DIAGNOSIS — F419 Anxiety disorder, unspecified: Secondary | ICD-10-CM | POA: Diagnosis not present

## 2023-11-15 DIAGNOSIS — F419 Anxiety disorder, unspecified: Secondary | ICD-10-CM | POA: Diagnosis not present

## 2023-11-18 DIAGNOSIS — F419 Anxiety disorder, unspecified: Secondary | ICD-10-CM | POA: Diagnosis not present

## 2023-11-21 DIAGNOSIS — F419 Anxiety disorder, unspecified: Secondary | ICD-10-CM | POA: Diagnosis not present

## 2023-11-25 DIAGNOSIS — F419 Anxiety disorder, unspecified: Secondary | ICD-10-CM | POA: Diagnosis not present

## 2023-11-28 DIAGNOSIS — F419 Anxiety disorder, unspecified: Secondary | ICD-10-CM | POA: Diagnosis not present

## 2023-12-02 ENCOUNTER — Telehealth: Payer: Self-pay | Admitting: Physician Assistant

## 2023-12-02 DIAGNOSIS — R112 Nausea with vomiting, unspecified: Secondary | ICD-10-CM | POA: Diagnosis not present

## 2023-12-02 DIAGNOSIS — J069 Acute upper respiratory infection, unspecified: Secondary | ICD-10-CM

## 2023-12-02 DIAGNOSIS — R197 Diarrhea, unspecified: Secondary | ICD-10-CM | POA: Diagnosis not present

## 2023-12-02 DIAGNOSIS — R6889 Other general symptoms and signs: Secondary | ICD-10-CM | POA: Diagnosis not present

## 2023-12-02 MED ORDER — OSELTAMIVIR PHOSPHATE 75 MG PO CAPS: 75.0000 mg | ORAL_CAPSULE | Freq: Two times a day (BID) | ORAL | 0 refills | Status: AC

## 2023-12-02 MED ORDER — ONDANSETRON 4 MG PO TBDP: 4.0000 mg | ORAL_TABLET | Freq: Three times a day (TID) | ORAL | 0 refills | Status: AC | PRN

## 2023-12-02 MED ORDER — PSEUDOEPH-BROMPHEN-DM 30-2-10 MG/5ML PO SYRP: 5.0000 mL | ORAL_SOLUTION | Freq: Four times a day (QID) | ORAL | 0 refills | Status: AC | PRN

## 2023-12-05 DIAGNOSIS — F419 Anxiety disorder, unspecified: Secondary | ICD-10-CM | POA: Diagnosis not present

## 2023-12-06 DIAGNOSIS — F419 Anxiety disorder, unspecified: Secondary | ICD-10-CM | POA: Diagnosis not present

## 2023-12-09 DIAGNOSIS — F419 Anxiety disorder, unspecified: Secondary | ICD-10-CM | POA: Diagnosis not present

## 2023-12-17 DIAGNOSIS — F419 Anxiety disorder, unspecified: Secondary | ICD-10-CM | POA: Diagnosis not present

## 2023-12-23 ENCOUNTER — Ambulatory Visit

## 2023-12-23 DIAGNOSIS — F419 Anxiety disorder, unspecified: Secondary | ICD-10-CM | POA: Diagnosis not present

## 2023-12-25 DIAGNOSIS — F419 Anxiety disorder, unspecified: Secondary | ICD-10-CM | POA: Diagnosis not present

## 2023-12-30 DIAGNOSIS — F419 Anxiety disorder, unspecified: Secondary | ICD-10-CM | POA: Diagnosis not present

## 2024-01-01 DIAGNOSIS — F419 Anxiety disorder, unspecified: Secondary | ICD-10-CM | POA: Diagnosis not present

## 2024-01-06 DIAGNOSIS — F419 Anxiety disorder, unspecified: Secondary | ICD-10-CM | POA: Diagnosis not present

## 2024-01-07 DIAGNOSIS — F419 Anxiety disorder, unspecified: Secondary | ICD-10-CM | POA: Diagnosis not present

## 2024-01-13 DIAGNOSIS — F419 Anxiety disorder, unspecified: Secondary | ICD-10-CM | POA: Diagnosis not present

## 2024-01-14 DIAGNOSIS — F419 Anxiety disorder, unspecified: Secondary | ICD-10-CM | POA: Diagnosis not present

## 2024-01-20 DIAGNOSIS — F419 Anxiety disorder, unspecified: Secondary | ICD-10-CM | POA: Diagnosis not present

## 2024-01-21 DIAGNOSIS — F419 Anxiety disorder, unspecified: Secondary | ICD-10-CM | POA: Diagnosis not present
